# Patient Record
Sex: Female | Born: 1950 | Race: White | Hispanic: No | Marital: Married | Smoking: Never smoker
Health system: Southern US, Community
[De-identification: ages and names within clinical notes are randomized; demographics above are authoritative.]

## PROBLEM LIST (undated history)

## (undated) DIAGNOSIS — M1990 Unspecified osteoarthritis, unspecified site: Secondary | ICD-10-CM

## (undated) DIAGNOSIS — E559 Vitamin D deficiency, unspecified: Secondary | ICD-10-CM

## (undated) DIAGNOSIS — J309 Allergic rhinitis, unspecified: Secondary | ICD-10-CM

## (undated) DIAGNOSIS — E669 Obesity, unspecified: Secondary | ICD-10-CM

## (undated) DIAGNOSIS — E785 Hyperlipidemia, unspecified: Secondary | ICD-10-CM

## (undated) DIAGNOSIS — M8580 Other specified disorders of bone density and structure, unspecified site: Secondary | ICD-10-CM

## (undated) DIAGNOSIS — K644 Residual hemorrhoidal skin tags: Secondary | ICD-10-CM

## (undated) DIAGNOSIS — Z973 Presence of spectacles and contact lenses: Secondary | ICD-10-CM

## (undated) DIAGNOSIS — I10 Essential (primary) hypertension: Secondary | ICD-10-CM

## (undated) DIAGNOSIS — F329 Major depressive disorder, single episode, unspecified: Secondary | ICD-10-CM

## (undated) DIAGNOSIS — R7611 Nonspecific reaction to tuberculin skin test without active tuberculosis: Secondary | ICD-10-CM

## (undated) DIAGNOSIS — Z8249 Family history of ischemic heart disease and other diseases of the circulatory system: Secondary | ICD-10-CM

## (undated) DIAGNOSIS — F32A Depression, unspecified: Secondary | ICD-10-CM

## (undated) HISTORY — PX: WISDOM TOOTH EXTRACTION: SHX21

## (undated) HISTORY — DX: Essential (primary) hypertension: I10

## (undated) HISTORY — DX: Presence of spectacles and contact lenses: Z97.3

## (undated) HISTORY — DX: Allergic rhinitis, unspecified: J30.9

## (undated) HISTORY — DX: Nonspecific reaction to tuberculin skin test without active tuberculosis: R76.11

## (undated) HISTORY — DX: Family history of ischemic heart disease and other diseases of the circulatory system: Z82.49

## (undated) HISTORY — DX: Obesity, unspecified: E66.9

## (undated) HISTORY — DX: Residual hemorrhoidal skin tags: K64.4

## (undated) HISTORY — DX: Hyperlipidemia, unspecified: E78.5

## (undated) HISTORY — PX: COLONOSCOPY: SHX174

## (undated) HISTORY — DX: Unspecified osteoarthritis, unspecified site: M19.90

## (undated) HISTORY — PX: TONSILLECTOMY: SUR1361

## (undated) HISTORY — DX: Depression, unspecified: F32.A

## (undated) HISTORY — DX: Vitamin D deficiency, unspecified: E55.9

## (undated) HISTORY — DX: Other specified disorders of bone density and structure, unspecified site: M85.80

---
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
## 1978-12-08 HISTORY — PX: INGUINAL HERNIA REPAIR: SUR1180

## 1999-06-13 ENCOUNTER — Other Ambulatory Visit: Admission: RE | Admit: 1999-06-13 | Discharge: 1999-06-13 | Payer: Self-pay | Admitting: Family Medicine

## 2000-07-07 ENCOUNTER — Other Ambulatory Visit: Admission: RE | Admit: 2000-07-07 | Discharge: 2000-07-07 | Payer: Self-pay | Admitting: Family Medicine

## 2000-07-31 ENCOUNTER — Encounter: Admission: RE | Admit: 2000-07-31 | Discharge: 2000-07-31 | Payer: Self-pay | Admitting: Family Medicine

## 2000-07-31 ENCOUNTER — Encounter: Payer: Self-pay | Admitting: Family Medicine

## 2001-08-04 ENCOUNTER — Encounter: Payer: Self-pay | Admitting: Family Medicine

## 2001-08-04 ENCOUNTER — Encounter: Admission: RE | Admit: 2001-08-04 | Discharge: 2001-08-04 | Payer: Self-pay | Admitting: Family Medicine

## 2003-05-25 ENCOUNTER — Encounter: Payer: Self-pay | Admitting: Family Medicine

## 2003-05-25 ENCOUNTER — Ambulatory Visit (HOSPITAL_COMMUNITY): Admission: RE | Admit: 2003-05-25 | Discharge: 2003-05-25 | Payer: Self-pay | Admitting: Family Medicine

## 2004-05-27 ENCOUNTER — Ambulatory Visit (HOSPITAL_COMMUNITY): Admission: RE | Admit: 2004-05-27 | Discharge: 2004-05-27 | Payer: Self-pay | Admitting: Family Medicine

## 2004-12-04 ENCOUNTER — Other Ambulatory Visit: Admission: RE | Admit: 2004-12-04 | Discharge: 2004-12-04 | Payer: Self-pay | Admitting: Family Medicine

## 2005-06-13 ENCOUNTER — Ambulatory Visit (HOSPITAL_COMMUNITY): Admission: RE | Admit: 2005-06-13 | Discharge: 2005-06-13 | Payer: Self-pay | Admitting: Family Medicine

## 2005-12-15 ENCOUNTER — Other Ambulatory Visit: Admission: RE | Admit: 2005-12-15 | Discharge: 2005-12-15 | Payer: Self-pay | Admitting: Family Medicine

## 2006-06-17 ENCOUNTER — Ambulatory Visit (HOSPITAL_COMMUNITY): Admission: RE | Admit: 2006-06-17 | Discharge: 2006-06-17 | Payer: Self-pay | Admitting: Family Medicine

## 2006-12-18 ENCOUNTER — Other Ambulatory Visit: Admission: RE | Admit: 2006-12-18 | Discharge: 2006-12-18 | Payer: Self-pay | Admitting: Family Medicine

## 2007-06-23 ENCOUNTER — Ambulatory Visit (HOSPITAL_COMMUNITY): Admission: RE | Admit: 2007-06-23 | Discharge: 2007-06-23 | Payer: Self-pay | Admitting: Family Medicine

## 2008-01-17 ENCOUNTER — Other Ambulatory Visit: Admission: RE | Admit: 2008-01-17 | Discharge: 2008-01-17 | Payer: Self-pay | Admitting: Family Medicine

## 2008-06-23 ENCOUNTER — Ambulatory Visit (HOSPITAL_COMMUNITY): Admission: RE | Admit: 2008-06-23 | Discharge: 2008-06-23 | Payer: Self-pay | Admitting: Family Medicine

## 2008-12-08 LAB — HM COLONOSCOPY

## 2009-01-26 ENCOUNTER — Other Ambulatory Visit: Admission: RE | Admit: 2009-01-26 | Discharge: 2009-01-26 | Payer: Self-pay | Admitting: Family Medicine

## 2009-05-25 ENCOUNTER — Ambulatory Visit: Payer: Self-pay | Admitting: Family Medicine

## 2009-06-26 ENCOUNTER — Ambulatory Visit (HOSPITAL_COMMUNITY): Admission: RE | Admit: 2009-06-26 | Discharge: 2009-06-26 | Payer: Self-pay | Admitting: Family Medicine

## 2009-07-17 ENCOUNTER — Ambulatory Visit: Payer: Self-pay | Admitting: Family Medicine

## 2009-07-24 ENCOUNTER — Ambulatory Visit: Payer: Self-pay | Admitting: Family Medicine

## 2009-10-03 ENCOUNTER — Ambulatory Visit: Payer: Self-pay | Admitting: Family Medicine

## 2009-10-08 ENCOUNTER — Ambulatory Visit: Payer: Self-pay | Admitting: Family Medicine

## 2009-11-07 ENCOUNTER — Ambulatory Visit: Payer: Self-pay | Admitting: Family Medicine

## 2010-04-16 ENCOUNTER — Ambulatory Visit: Payer: Self-pay | Admitting: Family Medicine

## 2010-06-27 ENCOUNTER — Ambulatory Visit (HOSPITAL_COMMUNITY): Admission: RE | Admit: 2010-06-27 | Discharge: 2010-06-27 | Payer: Self-pay | Admitting: Family Medicine

## 2010-08-16 ENCOUNTER — Ambulatory Visit: Payer: Self-pay | Admitting: Family Medicine

## 2010-08-20 ENCOUNTER — Ambulatory Visit: Payer: Self-pay | Admitting: Family Medicine

## 2010-08-22 ENCOUNTER — Ambulatory Visit: Payer: Self-pay | Admitting: Family Medicine

## 2010-08-22 ENCOUNTER — Encounter: Admission: RE | Admit: 2010-08-22 | Discharge: 2010-08-22 | Payer: Self-pay | Admitting: Family Medicine

## 2011-04-03 ENCOUNTER — Ambulatory Visit (INDEPENDENT_AMBULATORY_CARE_PROVIDER_SITE_OTHER): Payer: BC Managed Care – PPO | Admitting: Family Medicine

## 2011-04-03 DIAGNOSIS — J309 Allergic rhinitis, unspecified: Secondary | ICD-10-CM

## 2011-05-29 ENCOUNTER — Other Ambulatory Visit: Payer: Self-pay | Admitting: Family Medicine

## 2011-05-29 DIAGNOSIS — Z1231 Encounter for screening mammogram for malignant neoplasm of breast: Secondary | ICD-10-CM

## 2011-06-30 ENCOUNTER — Ambulatory Visit (HOSPITAL_COMMUNITY)
Admission: RE | Admit: 2011-06-30 | Discharge: 2011-06-30 | Disposition: A | Discharge status: Home / Self Care | Payer: BC Managed Care – PPO | Source: Ambulatory Visit | Attending: Family Medicine | Admitting: Family Medicine

## 2011-06-30 DIAGNOSIS — Z1231 Encounter for screening mammogram for malignant neoplasm of breast: Secondary | ICD-10-CM | POA: Insufficient documentation

## 2011-08-19 ENCOUNTER — Encounter: Payer: Self-pay | Admitting: Family Medicine

## 2011-09-02 ENCOUNTER — Other Ambulatory Visit (HOSPITAL_COMMUNITY)
Admission: RE | Admit: 2011-09-02 | Discharge: 2011-09-02 | Disposition: A | Discharge status: Home / Self Care | Payer: BC Managed Care – PPO | Source: Ambulatory Visit | Attending: Family Medicine | Admitting: Family Medicine

## 2011-09-02 ENCOUNTER — Encounter: Payer: Self-pay | Admitting: Family Medicine

## 2011-09-02 ENCOUNTER — Ambulatory Visit (INDEPENDENT_AMBULATORY_CARE_PROVIDER_SITE_OTHER): Payer: BC Managed Care – PPO | Admitting: Family Medicine

## 2011-09-02 VITALS — BP 130/80 | HR 74 | Ht 65.5 in | Wt 193.0 lb

## 2011-09-02 DIAGNOSIS — I10 Essential (primary) hypertension: Secondary | ICD-10-CM | POA: Insufficient documentation

## 2011-09-02 DIAGNOSIS — M25569 Pain in unspecified knee: Secondary | ICD-10-CM

## 2011-09-02 DIAGNOSIS — M25562 Pain in left knee: Secondary | ICD-10-CM

## 2011-09-02 DIAGNOSIS — E785 Hyperlipidemia, unspecified: Secondary | ICD-10-CM | POA: Insufficient documentation

## 2011-09-02 DIAGNOSIS — J301 Allergic rhinitis due to pollen: Secondary | ICD-10-CM | POA: Insufficient documentation

## 2011-09-02 DIAGNOSIS — Z01419 Encounter for gynecological examination (general) (routine) without abnormal findings: Secondary | ICD-10-CM | POA: Insufficient documentation

## 2011-09-02 DIAGNOSIS — M899 Disorder of bone, unspecified: Secondary | ICD-10-CM

## 2011-09-02 DIAGNOSIS — Z79899 Other long term (current) drug therapy: Secondary | ICD-10-CM

## 2011-09-02 DIAGNOSIS — E669 Obesity, unspecified: Secondary | ICD-10-CM

## 2011-09-02 DIAGNOSIS — Z0000 Encounter for general adult medical examination without abnormal findings: Secondary | ICD-10-CM

## 2011-09-02 DIAGNOSIS — Z23 Encounter for immunization: Secondary | ICD-10-CM

## 2011-09-02 DIAGNOSIS — M8580 Other specified disorders of bone density and structure, unspecified site: Secondary | ICD-10-CM | POA: Insufficient documentation

## 2011-09-02 LAB — CBC WITH DIFFERENTIAL/PLATELET
Basophils Absolute: 0.1 10*3/uL (ref 0.0–0.1)
HCT: 38.4 % (ref 36.0–46.0)
Hemoglobin: 12.9 g/dL (ref 12.0–15.0)
Lymphocytes Relative: 36 % (ref 12–46)
Monocytes Absolute: 0.5 10*3/uL (ref 0.1–1.0)
Neutro Abs: 5.1 10*3/uL (ref 1.7–7.7)
RBC: 4.37 MIL/uL (ref 3.87–5.11)
RDW: 13 % (ref 11.5–15.5)
WBC: 9.1 10*3/uL (ref 4.0–10.5)

## 2011-09-02 LAB — COMPREHENSIVE METABOLIC PANEL
ALT: 34 U/L (ref 0–35)
AST: 25 U/L (ref 0–37)
Albumin: 4.3 g/dL (ref 3.5–5.2)
BUN: 13 mg/dL (ref 6–23)
Calcium: 9.5 mg/dL (ref 8.4–10.5)
Chloride: 100 mEq/L (ref 96–112)
Potassium: 3.6 mEq/L (ref 3.5–5.3)

## 2011-09-02 LAB — POCT URINALYSIS DIPSTICK
Bilirubin, UA: NEGATIVE
Glucose, UA: NEGATIVE
Ketones, UA: NEGATIVE
Leukocytes, UA: NEGATIVE
Protein, UA: NEGATIVE

## 2011-09-02 LAB — LIPID PANEL: HDL: 46 mg/dL (ref >39)

## 2011-09-02 NOTE — Progress Notes (Signed)
Subjective:    Patient ID: Karina Ingram, female    DOB: 12-24-1950, 60 y.o.   MRN: [id number]  HPI She is here for a complete examination. She does have underlying spring and fall allergies and is on medications for this and doing well. She continues on her blood pressure medication. She has a history of osteopenia and is using vitamin D and calcium. She continues on her Lipitor and is having no difficulty with this. She has no other concerns or complaints. Her social and family history were reviewed.  Review of Systems  Constitutional: Negative.   HENT: Negative.   Eyes: Negative.   Respiratory: Negative.   Cardiovascular: Negative.   Gastrointestinal: Negative.   Genitourinary: Negative.   Musculoskeletal: Negative.   Skin: Negative.   Neurological: Negative.   Hematological: Negative.        Objective:   Physical Exam BP 130/80  Pulse 74  Ht 5' 5.5" (1.664 m)  Wt 193 lb (87.544 kg)  BMI 31.63 kg/m2  General Appearance:    Alert, cooperative, no distress, appears stated age  Head:    Normocephalic, without obvious abnormality, atraumatic  Eyes:    PERRL, conjunctiva/corneas clear, EOM's intact, fundi    benign  Ears:    Normal TM's and external ear canals  Nose:   Nares normal, mucosa normal, no drainage or sinus   tenderness  Throat:   Lips, mucosa, and tongue normal; teeth and gums normal  Neck:   Supple, no lymphadenopathy;  thyroid:  no   enlargement/tenderness/nodules; no carotid   bruit or JVD  Back:    Spine nontender, no curvature, ROM normal, no CVA     tenderness  Lungs:     Clear to auscultation bilaterally without wheezes, rales or     ronchi; respirations unlabored  Chest Wall:    No tenderness or deformity   Heart:    Regular rate and rhythm, S1 and S2 normal, no murmur, rub   or gallop  Breast Exam:    No tenderness, masses, or nipple discharge or inversion.      No axillary lymphadenopathy  Abdomen:     Soft, non-tender, nondistended, normoactive  bowel sounds,    no masses, no hepatosplenomegaly  Genitalia:    Normal external genitalia without lesions.  BUS and vagina normal; cervix without lesions, or cervical motion tenderness. No abnormal vaginal discharge.  Uterus and adnexa not enlarged, nontender, no masses.  Pap performed  Rectal:    Normal tone, no masses or tenderness; guaiac negative stool  Extremities:   No clubbing, cyanosis or edema  Pulses:   2+ and symmetric all extremities  Skin:   Skin color, texture, turgor normal, no rashes or lesions  Lymph nodes:   Cervical, supraclavicular, and axillary nodes normal  Neurologic:   CNII-XII intact, normal strength, sensation and gait; reflexes 2+ and symmetric throughout          Psych:   Normal mood, affect, hygiene and grooming.          Assessment & Plan:   1. Left knee pain  DG Knee 1-2 Views Left  2. Physical exam, annual  POCT Urinalysis Dipstick, Cytology - PAP  3. Hypertension  CBC w/Diff, Comprehensive metabolic panel, Lipid Profile  4. Allergic rhinitis due to pollen    5. Obesity (BMI 30-39.9)  CBC w/Diff, Comprehensive metabolic panel, Lipid Profile  6. Osteopenia  DG Bone Density  7. Hyperlipidemia LDL goal < 100  Lipid Profile  8. Encounter  for long-term (current) use of other medications  CBC w/Diff, Comprehensive metabolic panel, Lipid Profile   The knee pain is an error

## 2011-09-03 ENCOUNTER — Other Ambulatory Visit: Payer: Self-pay | Admitting: *Deleted

## 2011-09-03 MED ORDER — ATORVASTATIN CALCIUM 10 MG PO TABS: 5.0000 mg | ORAL_TABLET | Freq: Every day | ORAL | Status: DC

## 2011-09-03 MED ORDER — LISINOPRIL-HYDROCHLOROTHIAZIDE 20-12.5 MG PO TABS: 1.0000 | ORAL_TABLET | Freq: Every day | ORAL | Status: DC

## 2011-09-03 MED ORDER — AMLODIPINE BESYLATE 5 MG PO TABS: 5.0000 mg | ORAL_TABLET | Freq: Every day | ORAL | Status: DC

## 2011-09-03 NOTE — Telephone Encounter (Addendum)
Message copied by Dorthula Perfect on Wed Sep 03, 2011  3:05 PM ------      Message from: Laureen Ochs F      Created: Wed Sep 03, 2011  9:06 AM      The blood work looks good. Continue on present medications. Renew any of her meds that she needs   Pt notified of lab results.  Sent in medications lisinopril HCTZ, atorvastatin, and norvasc to [facility].  CM, LPN

## 2011-09-05 ENCOUNTER — Telehealth: Payer: Self-pay

## 2011-09-05 NOTE — Telephone Encounter (Signed)
To let Karina Ingram know all labs were good

## 2012-05-25 ENCOUNTER — Other Ambulatory Visit: Payer: Self-pay | Admitting: Family Medicine

## 2012-05-26 ENCOUNTER — Other Ambulatory Visit: Payer: Self-pay | Admitting: Family Medicine

## 2012-05-26 DIAGNOSIS — Z1231 Encounter for screening mammogram for malignant neoplasm of breast: Secondary | ICD-10-CM

## 2012-06-18 ENCOUNTER — Other Ambulatory Visit: Payer: Self-pay | Admitting: Family Medicine

## 2012-06-30 ENCOUNTER — Ambulatory Visit (HOSPITAL_COMMUNITY)
Admission: RE | Admit: 2012-06-30 | Discharge: 2012-06-30 | Disposition: A | Discharge status: Home / Self Care | Payer: BC Managed Care – PPO | Source: Ambulatory Visit | Attending: Family Medicine | Admitting: Family Medicine

## 2012-06-30 DIAGNOSIS — Z1231 Encounter for screening mammogram for malignant neoplasm of breast: Secondary | ICD-10-CM | POA: Insufficient documentation

## 2012-09-14 ENCOUNTER — Encounter: Payer: Self-pay | Admitting: Medical

## 2012-09-14 ENCOUNTER — Ambulatory Visit (INDEPENDENT_AMBULATORY_CARE_PROVIDER_SITE_OTHER): Payer: BC Managed Care – PPO | Admitting: Medical

## 2012-09-14 VITALS — BP 132/88 | HR 68 | Temp 98.2°F | Resp 16 | Wt 195.0 lb

## 2012-09-14 DIAGNOSIS — Z23 Encounter for immunization: Secondary | ICD-10-CM

## 2012-09-14 DIAGNOSIS — B354 Tinea corporis: Secondary | ICD-10-CM

## 2012-09-14 MED ORDER — TERBINAFINE HCL 1 % EX CREA: TOPICAL_CREAM | Freq: Two times a day (BID) | CUTANEOUS | Status: DC

## 2012-09-14 NOTE — Progress Notes (Signed)
Subjective: Here for rash on back of neck x last few days.  Only exposure is she adopted a new dog that had several skin issues, had to be shaved due to infections.  No other skin contacts with similar rash.  Rash is red, round and itchy.  No prior similar.  thinks its ringworm.   Objective: Gen: wd, wn, nad Skin: left posterior neck with 2cm round well defined area of erythema, raised border and clearing center  Assessment: Encounter Diagnoses  Name Primary?  . Tinea corporis Yes  . Need for influenza vaccination    Plan: Tinea - begin Terbinafine cream, wash hands often, and call if not resolving. C/t cream for several days once rash has resolved.   Flu vaccine, VIS and counseling given.

## 2012-09-14 NOTE — Patient Instructions (Signed)
Begin OTC antifungal cream such as Lamisil cream or Terbinafine cream or Tolnaftate cream topically for 1-2 weeks.  This should clear this up.  If not, let me know.

## 2012-09-23 ENCOUNTER — Telehealth: Payer: Self-pay | Admitting: Medical

## 2012-09-23 NOTE — Telephone Encounter (Signed)
Pt advised.

## 2012-09-23 NOTE — Telephone Encounter (Signed)
Advise--contagious until resolved.  Avoid direct contact with others

## 2012-10-22 ENCOUNTER — Other Ambulatory Visit: Payer: Self-pay | Admitting: Family Medicine

## 2012-12-20 ENCOUNTER — Ambulatory Visit (INDEPENDENT_AMBULATORY_CARE_PROVIDER_SITE_OTHER): Payer: BC Managed Care – PPO | Admitting: Medical

## 2012-12-20 ENCOUNTER — Encounter: Payer: Self-pay | Admitting: Medical

## 2012-12-20 VITALS — BP 118/80 | HR 62 | Temp 97.9°F | Resp 16 | Wt 193.0 lb

## 2012-12-20 DIAGNOSIS — E785 Hyperlipidemia, unspecified: Secondary | ICD-10-CM

## 2012-12-20 DIAGNOSIS — M899 Disorder of bone, unspecified: Secondary | ICD-10-CM

## 2012-12-20 DIAGNOSIS — Z79899 Other long term (current) drug therapy: Secondary | ICD-10-CM

## 2012-12-20 DIAGNOSIS — I10 Essential (primary) hypertension: Secondary | ICD-10-CM

## 2012-12-20 DIAGNOSIS — M8580 Other specified disorders of bone density and structure, unspecified site: Secondary | ICD-10-CM

## 2012-12-20 LAB — LIPID PANEL
Cholesterol: 169 mg/dL (ref 0–200)
LDL Cholesterol: 86 mg/dL (ref 0–99)
Triglycerides: 181 mg/dL — ABNORMAL HIGH (ref <150)

## 2012-12-20 LAB — COMPREHENSIVE METABOLIC PANEL
Albumin: 4.5 g/dL (ref 3.5–5.2)
CO2: 30 mEq/L (ref 19–32)
Calcium: 10.3 mg/dL (ref 8.4–10.5)
Glucose, Bld: 96 mg/dL (ref 70–99)
Potassium: 4.5 mEq/L (ref 3.5–5.3)
Sodium: 142 mEq/L (ref 135–145)
Total Protein: 7 g/dL (ref 6.0–8.3)

## 2012-12-20 LAB — CBC WITH DIFFERENTIAL/PLATELET
Eosinophils Absolute: 0.3 10*3/uL (ref 0.0–0.7)
Lymphocytes Relative: 30 % (ref 12–46)
Lymphs Abs: 2.5 10*3/uL (ref 0.7–4.0)
Neutrophils Relative %: 59 % (ref 43–77)
Platelets: 335 10*3/uL (ref 150–400)
RBC: 4.88 MIL/uL (ref 3.87–5.11)
WBC: 8.5 10*3/uL (ref 4.0–10.5)

## 2012-12-20 MED ORDER — AMLODIPINE BESYLATE 5 MG PO TABS: 5.0000 mg | ORAL_TABLET | Freq: Every day | ORAL | Status: DC

## 2012-12-20 MED ORDER — LISINOPRIL-HYDROCHLOROTHIAZIDE 20-12.5 MG PO TABS: 1.0000 | ORAL_TABLET | Freq: Every day | ORAL | Status: DC

## 2012-12-20 NOTE — Progress Notes (Signed)
Subjective: Here for routine medication refills, needs medication, fasting lab check today.   She is on medication and compliant for blood pressure and cholesterol.  Wasn't exercising much this fall as mother-in-law was in final stages of her illness.  She has started exercising back now.   getting better with diet now that the holidays are getting behind [place].  No major c/o.  She is taking Ca+D.  Last bone density prior to 2012 was 03/2011 but she never heard results of either bone density scan.  No new c/o.  Past Medical History  Diagnosis Date  . Hypertension   . Dyslipidemia   . Arthritis   . Allergy     RHINITIS  . Positive PPD, treated   . Osteopenia    ROS in subjective  Objective Gen: wd, wn, nad Skin unremarkable Neck supple, nontender, no mass,no thyromegaly Lungs clear Heart RRR, normal S1, S2, no murmurs Abdomen: +bs, soft, nontender, no mass or organomegaly Pulses normal  Assessment: Encounter Diagnoses  Name Primary?  . Essential hypertension, benign Yes  . Hyperlipidemia   . Osteopenia   . Encounter for long-term (current) use of other medications     Plan: C/t current medications.  Labs today.  C/t Ca+Vit D.  Advised weight bearing and aerobic exercise, healthy diet, and we will call back with lab results and  Recommendations regarding bone density.  Will also try and get prior 03/2009 bone density results to compare.  Pending labs, refill lipid medication.   BP medications refilled today.

## 2012-12-21 ENCOUNTER — Other Ambulatory Visit: Payer: Self-pay | Admitting: Medical

## 2012-12-21 LAB — VITAMIN D 25 HYDROXY (VIT D DEFICIENCY, FRACTURES): Vit D, 25-Hydroxy: 42 ng/mL (ref 30–89)

## 2012-12-21 MED ORDER — ATORVASTATIN CALCIUM 10 MG PO TABS: 10.0000 mg | ORAL_TABLET | Freq: Every day | ORAL | Status: DC

## 2013-03-08 ENCOUNTER — Encounter: Payer: Self-pay | Admitting: Medical

## 2013-04-08 ENCOUNTER — Other Ambulatory Visit: Payer: Self-pay | Admitting: Family Medicine

## 2013-04-09 ENCOUNTER — Other Ambulatory Visit: Payer: Self-pay | Admitting: Family Medicine

## 2013-06-03 ENCOUNTER — Other Ambulatory Visit: Payer: Self-pay | Admitting: Family Medicine

## 2013-06-03 DIAGNOSIS — Z1231 Encounter for screening mammogram for malignant neoplasm of breast: Secondary | ICD-10-CM

## 2013-07-01 ENCOUNTER — Ambulatory Visit (HOSPITAL_COMMUNITY)
Admission: RE | Admit: 2013-07-01 | Discharge: 2013-07-01 | Disposition: A | Discharge status: Home / Self Care | Payer: BC Managed Care – PPO | Source: Ambulatory Visit | Attending: Family Medicine | Admitting: Family Medicine

## 2013-07-01 DIAGNOSIS — Z1231 Encounter for screening mammogram for malignant neoplasm of breast: Secondary | ICD-10-CM | POA: Insufficient documentation

## 2013-09-22 LAB — HM DEXA SCAN

## 2013-09-27 ENCOUNTER — Telehealth: Payer: Self-pay | Admitting: Medical

## 2013-09-27 NOTE — Telephone Encounter (Signed)
Patient is aware of her Bone density results. CLS

## 2013-09-27 NOTE — Telephone Encounter (Signed)
I reviewed her bone density scan.  There was actually slight improvement in the bone density from 2012.   Thus, c/t Calcium 1200mg  daily, 1000 IU Vit D daily, routine exercise, and f/u in January for physical.   Next bone density scan not recommended until 2019.

## 2013-09-27 NOTE — Telephone Encounter (Signed)
LMOM TO CB. CLS 

## 2013-09-28 ENCOUNTER — Encounter: Payer: Self-pay | Admitting: Internal Medicine

## 2013-12-06 ENCOUNTER — Encounter: Payer: Self-pay | Admitting: Medical

## 2013-12-12 ENCOUNTER — Encounter: Payer: Self-pay | Admitting: Medical

## 2013-12-12 ENCOUNTER — Ambulatory Visit (INDEPENDENT_AMBULATORY_CARE_PROVIDER_SITE_OTHER): Payer: BC Managed Care – PPO | Admitting: Medical

## 2013-12-12 ENCOUNTER — Other Ambulatory Visit (HOSPITAL_COMMUNITY)
Admission: RE | Admit: 2013-12-12 | Discharge: 2013-12-12 | Disposition: A | Discharge status: Home / Self Care | Payer: BC Managed Care – PPO | Source: Ambulatory Visit | Attending: Family Medicine | Admitting: Family Medicine

## 2013-12-12 VITALS — BP 160/90 | HR 60 | Temp 97.5°F | Resp 16 | Ht 66.0 in | Wt 194.0 lb

## 2013-12-12 DIAGNOSIS — Z124 Encounter for screening for malignant neoplasm of cervix: Secondary | ICD-10-CM

## 2013-12-12 DIAGNOSIS — E559 Vitamin D deficiency, unspecified: Secondary | ICD-10-CM

## 2013-12-12 DIAGNOSIS — E785 Hyperlipidemia, unspecified: Secondary | ICD-10-CM

## 2013-12-12 DIAGNOSIS — I10 Essential (primary) hypertension: Secondary | ICD-10-CM

## 2013-12-12 DIAGNOSIS — Z23 Encounter for immunization: Secondary | ICD-10-CM

## 2013-12-12 DIAGNOSIS — Z2911 Encounter for prophylactic immunotherapy for respiratory syncytial virus (RSV): Secondary | ICD-10-CM

## 2013-12-12 DIAGNOSIS — Z0000 Encounter for general adult medical examination without abnormal findings: Secondary | ICD-10-CM

## 2013-12-12 DIAGNOSIS — Z01419 Encounter for gynecological examination (general) (routine) without abnormal findings: Secondary | ICD-10-CM | POA: Insufficient documentation

## 2013-12-12 LAB — POCT URINALYSIS DIPSTICK
Bilirubin, UA: NEGATIVE
Glucose, UA: NEGATIVE
Ketones, UA: NEGATIVE
Leukocytes, UA: NEGATIVE
Nitrite, UA: NEGATIVE
PROTEIN UA: NEGATIVE
RBC UA: NEGATIVE
UROBILINOGEN UA: NEGATIVE
pH, UA: 6

## 2013-12-12 LAB — COMPREHENSIVE METABOLIC PANEL
ALT: 53 U/L — ABNORMAL HIGH (ref 0–35)
AST: 33 U/L (ref 0–37)
Albumin: 4 g/dL (ref 3.5–5.2)
Alkaline Phosphatase: 116 U/L (ref 39–117)
BUN: 13 mg/dL (ref 6–23)
CO2: 28 mEq/L (ref 19–32)
Calcium: 10.4 mg/dL (ref 8.4–10.5)
Chloride: 101 mEq/L (ref 96–112)
Creat: 0.69 mg/dL (ref 0.50–1.10)
GLUCOSE: 88 mg/dL (ref 70–99)
Potassium: 4.4 mEq/L (ref 3.5–5.3)
SODIUM: 141 meq/L (ref 135–145)
Total Bilirubin: 0.5 mg/dL (ref 0.3–1.2)
Total Protein: 7 g/dL (ref 6.0–8.3)

## 2013-12-12 LAB — CBC
HCT: 41.3 % (ref 36.0–46.0)
HEMOGLOBIN: 14.2 g/dL (ref 12.0–15.0)
MCH: 29.2 pg (ref 26.0–34.0)
MCHC: 34.4 g/dL (ref 30.0–36.0)
MCV: 85 fL (ref 78.0–100.0)
Platelets: 330 10*3/uL (ref 150–400)
RBC: 4.86 MIL/uL (ref 3.87–5.11)
RDW: 13.6 % (ref 11.5–15.5)
WBC: 9.2 10*3/uL (ref 4.0–10.5)

## 2013-12-12 NOTE — Patient Instructions (Signed)
Please check your insurance coverage for NMR lipo profile lab to better evaluate your lipids.

## 2013-12-12 NOTE — Progress Notes (Signed)
Subjective:   HPI  Karina Ingram is a 63 y.o. female who presents for a complete physical.   Preventative care: Last ophthalmology visit:yes Dr. Allyne Gee Last dental visit:Dr adriatto Last colonoscopy:5 years years ago Last mammogram:7 2014 Last gynecological exam: Last ZOX:WRUE Last labs:?  Prior vaccinations: TD or Tdap:2011 Influenza:10 2013 Pneumococcal: never Shingles/Zostavax:12/12/2013 Other: no other doctors  Advanced directive:n/a Health care power of attorney:n/a Living will:n/a  Reviewed their medical, surgical, family, social, medication, and allergy history and updated chart as appropriate.  Past Medical History  Diagnosis Date  . Hypertension   . Dyslipidemia   . Arthritis   . Allergy     RHINITIS  . Positive PPD, treated   . Osteopenia     bone density 2013  . Wears contact lenses   . Hyperlipidemia   . External hemorrhoid   . Vitamin D deficiency     Past Surgical History  Procedure Laterality Date  . Hernia repair      inguinal, early '80s  . Tonsillectomy    . Wisdom tooth extraction    . Colonoscopy      [facility], age 36yo    History   Social History  . Marital Status: Married    Spouse Name: N/A    Number of Children: N/A  . Years of Education: N/A   Occupational History  . Not on file.   Social History Main Topics  . Smoking status: Never Smoker   . Smokeless tobacco: Never Used  . Alcohol Use: Yes  . Drug Use: No  . Sexual Activity: Yes   Other Topics Concern  . Not on file   Social History Narrative   Married, [employer], exercise some with walking, teacher    Family History  Problem Relation Age of Onset  . Cancer Mother     hematologic  . Heart disease Mother     angina  . Hypertension Mother   . Stroke Father   . Hypertension Father   . Heart disease Father   . Other Sister     adopted  . Cancer Brother     renal  . Hypertension Brother   . Hyperlipidemia Brother     Current outpatient  prescriptions:amLODipine (NORVASC) 5 MG tablet, Take 1 tablet (5 mg total) by mouth daily., Disp: 90 tablet, Rfl: 3;  aspirin 81 MG tablet, Take 81 mg by mouth daily.  , Disp: , Rfl: ;  atorvastatin (LIPITOR) 10 MG tablet, Take 1 tablet (10 mg total) by mouth daily., Disp: 45 tablet, Rfl: 3;  calcium-vitamin D (OSCAL WITH D) 500-200 MG-UNIT per tablet, Take 1 tablet by mouth daily.  , Disp: , Rfl:  fexofenadine (ALLEGRA) 30 MG tablet, Take 30 mg by mouth 2 (two) times daily.  , Disp: , Rfl: ;  fish oil-omega-3 fatty acids 1000 MG capsule, Take 2 g by mouth daily.  , Disp: , Rfl: ;  fluticasone (FLONASE) 50 MCG/ACT nasal spray, USE TWO SPRAYS IN EACH NOSTRIL DAILY, Disp: 16 g, Rfl: 5;  lisinopril-hydrochlorothiazide (PRINZIDE,ZESTORETIC) 20-12.5 MG per tablet, Take 1 tablet by mouth daily., Disp: 90 tablet, Rfl: 3 Multiple Vitamins-Minerals (MULTIVITAMIN WITH MINERALS) tablet, Take 1 tablet by mouth daily.  , Disp: , Rfl:   No Known Allergies      Review of Systems Constitutional: -fever, -chills, -sweats, -unexpected weight change, -decreased appetite, -fatigue Allergy: -sneezing, -itching, -congestion Dermatology: -changing moles, --rash, -lumps ENT: -runny nose, -ear pain, -sore throat, -hoarseness, -sinus pain, -teeth pain, -  ringing in ears, -hearing loss, -nosebleeds Cardiology: -chest pain, -palpitations, +swelling, -difficulty breathing when lying flat, -waking up short of breath Respiratory: -cough, -shortness of breath, -difficulty breathing with exercise or exertion, -wheezing, -coughing up blood Gastroenterology: -abdominal pain, -nausea, -vomiting, -diarrhea, -constipation, -blood in stool, -changes in bowel movement, -difficulty swallowing or eating Hematology: -bleeding, -bruising  Musculoskeletal: -joint aches, -muscle aches, -joint swelling, -back pain, -neck pain, -cramping, -changes in gait Ophthalmology: denies vision changes, eye redness, itching, discharge Urology: -burning  with urination, -difficulty urinating, -blood in urine, -urinary frequency, -urgency, -incontinence Neurology: -headache, -weakness, -tingling, -numbness, -memory loss, -falls, -dizziness Psychology: -depressed mood, -agitation, -sleep problems     Objective:   Physical Exam  BP 160/90  Pulse 60  Temp(Src) 97.5 F (36.4 C) (Oral)  Resp 16  Ht 5\' 6"  (1.676 m)  Wt 194 lb (87.998 kg)  BMI 31.33 kg/m2  General appearance: alert, no distress, WD/WN, white female Skin: scattered benign lesions, several cherry hemangiomas, small throughout torso, no particular worrisome lesions HEENT: normocephalic, conjunctiva/corneas normal, sclerae anicteric, PERRLA, EOMi, nares patent, no discharge or erythema, pharynx normal Oral cavity: MMM, tongue normal, teeth in good repair Neck: supple, no lymphadenopathy, no thyromegaly, no masses, normal ROM, no bruits Chest: non tender, normal shape and expansion Heart: RRR, normal S1, S2, no murmurs Lungs: CTA bilaterally, no wheezes, rhonchi, or rales Abdomen: +bs, soft, non tender, non distended, no masses, no hepatomegaly, no splenomegaly, no bruits Back: non tender, normal ROM, no scoliosis Musculoskeletal: upper extremities non tender, no obvious deformity, normal ROM throughout, lower extremities non tender, no obvious deformity, normal ROM throughout Extremities: no edema, no cyanosis, no clubbing Pulses: 2+ symmetric, upper and lower extremities, normal cap refill Neurological: alert, oriented x 3, CN2-12 intact, strength normal upper extremities and lower extremities, sensation normal throughout, DTRs 2+ throughout, no cerebellar signs, gait normal Psychiatric: normal affect, behavior normal, pleasant  Breast: nontender, no masses or lumps, no skin changes, no nipple discharge or inversion, no axillary lymphadenopathy Gyn: Normal external genitalia without lesions, vagina with normal mucosa, cervix without lesions, no cervical motion tenderness, no  abnormal vaginal discharge.  Uterus and adnexa not enlarged, nontender, no masses.  Pap performed.  Exam chaperoned by nurse. Rectal: anus with external hemorrhoids, normal tone, occult negative stool    Assessment and Plan :    Encounter Diagnoses  Name Primary?  . Routine general medical examination at a health care facility Yes  . Essential hypertension, benign   . Hyperlipidemia   . Unspecified vitamin D deficiency   . Screening for cervical cancer   . Need for shingles vaccine   . Need for prophylactic vaccination against Streptococcus pneumoniae (pneumococcus)     Physical exam - discussed healthy lifestyle, diet, exercise, preventative care, vaccinations, and addressed their concerns.  Handout given.  Labs today.   Hypertension-continue current medication Hyperlipidemia-she will check insurance  for an NMR lipoprofile lab.   Given her cardiac risk factors - HTN, family history, age, her prior lipid panels may not justify statin therapy, but we discussed the benefits and risks of medications. Vitamin D deficiency-labs today, continue current medication  Pap smear sent today  We discussed shingles and pneumococcal vaccines. Counseled on both. Shingles vaccine administered today, and she will followup no sooner than a month for pneumococcal vaccine She will see dermatology for a baseline skin surveillance.  Follow-up pending labs

## 2013-12-13 ENCOUNTER — Other Ambulatory Visit: Payer: Self-pay | Admitting: Medical

## 2013-12-13 LAB — VITAMIN D 25 HYDROXY (VIT D DEFICIENCY, FRACTURES): Vit D, 25-Hydroxy: 53 ng/mL (ref 30–89)

## 2013-12-30 NOTE — Addendum Note (Signed)
Addended by: Janeice RobinsonSCALES, Shyne Resch L on: 12/30/2013 01:48 PM   Modules accepted: Orders

## 2014-01-18 ENCOUNTER — Other Ambulatory Visit: Payer: BC Managed Care – PPO

## 2014-02-07 ENCOUNTER — Ambulatory Visit (INDEPENDENT_AMBULATORY_CARE_PROVIDER_SITE_OTHER): Payer: BC Managed Care – PPO | Admitting: Medical

## 2014-02-07 ENCOUNTER — Encounter: Payer: Self-pay | Admitting: Medical

## 2014-02-07 DIAGNOSIS — E782 Mixed hyperlipidemia: Secondary | ICD-10-CM

## 2014-02-07 DIAGNOSIS — Z8249 Family history of ischemic heart disease and other diseases of the circulatory system: Secondary | ICD-10-CM

## 2014-02-07 DIAGNOSIS — I10 Essential (primary) hypertension: Secondary | ICD-10-CM

## 2014-02-07 MED ORDER — LISINOPRIL-HYDROCHLOROTHIAZIDE 20-12.5 MG PO TABS: 1.0000 | ORAL_TABLET | Freq: Every day | ORAL | Status: DC

## 2014-02-07 MED ORDER — ATORVASTATIN CALCIUM 10 MG PO TABS: 10.0000 mg | ORAL_TABLET | Freq: Every day | ORAL | Status: DC

## 2014-02-07 NOTE — Progress Notes (Signed)
  Subjective:  Karina Ingram is a 63 y.o. female who presents to discuss her NMR lipo profile results.   At last visit she stopped her statin to check this test to further evaluate whether she needed to in fact be on a statin.  She is compliant with her BP medication, taking 81mg  ASA nightly, taking OTC fish oil, eats healthy.   No other aggravating or relieving factors.    No other c/o.  The following portions of the patient's history were reviewed and updated as appropriate: allergies, current medications, past family history, past medical history, past social history, past surgical history and problem list.  ROS Otherwise as in subjective above  Objective: Physical Exam  Vital signs reviewed  General appearance: alert, no distress, WD/WN   Assessment: Encounter Diagnoses  Name Primary?  . Essential hypertension, benign Yes  . Family history of premature CAD   . Mixed dyslipidemia      Plan: Patient's cardiac risk factors are: dyslipidemia, family history of premature cardiovascular disease and hypertension.   Discussed the lengthy [facility] NMR lipo profile results, her cardiac risks factors, means to lower her risk factors.  She will restart and c/t statin indefinitely.  She will check insurance coverage for repeat NMR Lipo profile 3-6 mo after begin back on statin.  She will stop OTC fish oil as it doesn't seem to be helping.  Discussed healthy low cholesterol heart healthy diet, discussed aspirin use daily QHS, c/t current BP medication.   Follow up: 3-6 mo, depending upon insurer's allowance of repeat NMR test while on statin.

## 2014-02-14 ENCOUNTER — Encounter: Payer: Self-pay | Admitting: Medical

## 2014-06-06 ENCOUNTER — Other Ambulatory Visit: Payer: Self-pay | Admitting: Family Medicine

## 2014-06-06 DIAGNOSIS — Z1231 Encounter for screening mammogram for malignant neoplasm of breast: Secondary | ICD-10-CM

## 2014-06-22 ENCOUNTER — Telehealth: Payer: Self-pay | Admitting: Family Medicine

## 2014-06-22 NOTE — Telephone Encounter (Signed)
CALLED AND LEFT MESSAGE WORD FOR WORD

## 2014-06-22 NOTE — Telephone Encounter (Signed)
Have her use any OTC med but also tried NyQuil at night. She still has a thin another week have her make an appointment .

## 2014-06-26 ENCOUNTER — Other Ambulatory Visit: Payer: Self-pay | Admitting: Medical

## 2014-07-05 ENCOUNTER — Ambulatory Visit (HOSPITAL_COMMUNITY)
Admission: RE | Admit: 2014-07-05 | Discharge: 2014-07-05 | Disposition: A | Discharge status: Home / Self Care | Payer: BC Managed Care – PPO | Source: Ambulatory Visit | Attending: Family Medicine | Admitting: Family Medicine

## 2014-07-05 DIAGNOSIS — Z1231 Encounter for screening mammogram for malignant neoplasm of breast: Secondary | ICD-10-CM | POA: Insufficient documentation

## 2014-07-25 ENCOUNTER — Telehealth: Payer: Self-pay | Admitting: Medical

## 2014-07-25 NOTE — Telephone Encounter (Signed)
Please call  Patient states she received a message in My Chart that she needed to schedule a colonoscopy  She states her last colonoscopy she thinks was in 2009, but she doesn't know where she had done

## 2014-07-26 NOTE — Telephone Encounter (Signed)
[facility]   The colonoscopy is not scanned in.  Let's see if we can pull the paper chart and look to find the records, scan the prior colonoscopy, and see what the recommendation was.   Very well could be do now.

## 2014-09-25 ENCOUNTER — Other Ambulatory Visit: Payer: Self-pay | Admitting: Medical

## 2014-10-07 ENCOUNTER — Encounter: Payer: Self-pay | Admitting: Internal Medicine

## 2014-12-11 ENCOUNTER — Telehealth: Payer: Self-pay | Admitting: Medical

## 2014-12-12 ENCOUNTER — Other Ambulatory Visit: Payer: Self-pay | Admitting: Family Medicine

## 2014-12-12 MED ORDER — AMLODIPINE BESYLATE 5 MG PO TABS: 5.0000 mg | ORAL_TABLET | Freq: Every day | ORAL | Status: DC

## 2014-12-12 MED ORDER — LISINOPRIL-HYDROCHLOROTHIAZIDE 20-12.5 MG PO TABS: 1.0000 | ORAL_TABLET | Freq: Every day | ORAL | Status: DC

## 2014-12-12 NOTE — Telephone Encounter (Signed)
Medication refills was sent to her pharmacy

## 2014-12-27 ENCOUNTER — Encounter: Payer: Self-pay | Admitting: Medical

## 2014-12-27 ENCOUNTER — Ambulatory Visit (INDEPENDENT_AMBULATORY_CARE_PROVIDER_SITE_OTHER): Payer: BC Managed Care – PPO | Admitting: Medical

## 2014-12-27 VITALS — BP 130/80 | HR 79 | Temp 98.0°F | Resp 14 | Ht 65.2 in | Wt 197.0 lb

## 2014-12-27 DIAGNOSIS — E559 Vitamin D deficiency, unspecified: Secondary | ICD-10-CM

## 2014-12-27 DIAGNOSIS — E785 Hyperlipidemia, unspecified: Secondary | ICD-10-CM | POA: Insufficient documentation

## 2014-12-27 DIAGNOSIS — H02401 Unspecified ptosis of right eyelid: Secondary | ICD-10-CM

## 2014-12-27 DIAGNOSIS — H9192 Unspecified hearing loss, left ear: Secondary | ICD-10-CM | POA: Diagnosis not present

## 2014-12-27 DIAGNOSIS — M8580 Other specified disorders of bone density and structure, unspecified site: Secondary | ICD-10-CM | POA: Diagnosis not present

## 2014-12-27 DIAGNOSIS — I8393 Asymptomatic varicose veins of bilateral lower extremities: Secondary | ICD-10-CM

## 2014-12-27 DIAGNOSIS — I8390 Asymptomatic varicose veins of unspecified lower extremity: Secondary | ICD-10-CM

## 2014-12-27 DIAGNOSIS — H02409 Unspecified ptosis of unspecified eyelid: Secondary | ICD-10-CM | POA: Insufficient documentation

## 2014-12-27 DIAGNOSIS — I10 Essential (primary) hypertension: Secondary | ICD-10-CM

## 2014-12-27 DIAGNOSIS — M25561 Pain in right knee: Secondary | ICD-10-CM | POA: Diagnosis not present

## 2014-12-27 DIAGNOSIS — H6122 Impacted cerumen, left ear: Secondary | ICD-10-CM

## 2014-12-27 DIAGNOSIS — J309 Allergic rhinitis, unspecified: Secondary | ICD-10-CM

## 2014-12-27 DIAGNOSIS — Z0000 Encounter for general adult medical examination without abnormal findings: Secondary | ICD-10-CM

## 2014-12-27 DIAGNOSIS — M25562 Pain in left knee: Secondary | ICD-10-CM

## 2014-12-27 DIAGNOSIS — E669 Obesity, unspecified: Secondary | ICD-10-CM | POA: Diagnosis not present

## 2014-12-27 LAB — LIPID PANEL
CHOL/HDL RATIO: 3.8 ratio
CHOLESTEROL: 191 mg/dL (ref 0–200)
HDL: 50 mg/dL (ref >39)
LDL Cholesterol: 87 mg/dL (ref 0–99)
Triglycerides: 269 mg/dL — ABNORMAL HIGH (ref <150)
VLDL: 54 mg/dL — AB (ref 0–40)

## 2014-12-27 LAB — COMPREHENSIVE METABOLIC PANEL
ALK PHOS: 97 U/L (ref 39–117)
ALT: 26 U/L (ref 0–35)
AST: 19 U/L (ref 0–37)
Albumin: 4 g/dL (ref 3.5–5.2)
BUN: 16 mg/dL (ref 6–23)
CO2: 31 mEq/L (ref 19–32)
Calcium: 9.7 mg/dL (ref 8.4–10.5)
Chloride: 101 mEq/L (ref 96–112)
Creat: 0.79 mg/dL (ref 0.50–1.10)
Glucose, Bld: 103 mg/dL — ABNORMAL HIGH (ref 70–99)
Potassium: 4.2 mEq/L (ref 3.5–5.3)
SODIUM: 140 meq/L (ref 135–145)
Total Bilirubin: 0.6 mg/dL (ref 0.2–1.2)
Total Protein: 7.1 g/dL (ref 6.0–8.3)

## 2014-12-27 LAB — CBC
HCT: 43.6 % (ref 36.0–46.0)
HEMOGLOBIN: 14.5 g/dL (ref 12.0–15.0)
MCH: 29.4 pg (ref 26.0–34.0)
MCHC: 33.3 g/dL (ref 30.0–36.0)
MCV: 88.3 fL (ref 78.0–100.0)
MPV: 9.3 fL (ref 8.6–12.4)
Platelets: 332 10*3/uL (ref 150–400)
RBC: 4.94 MIL/uL (ref 3.87–5.11)
RDW: 13.7 % (ref 11.5–15.5)
WBC: 6.9 10*3/uL (ref 4.0–10.5)

## 2014-12-27 LAB — POCT URINALYSIS DIPSTICK
BILIRUBIN UA: NEGATIVE
Blood, UA: NEGATIVE
Glucose, UA: NEGATIVE
Ketones, UA: NEGATIVE
Nitrite, UA: NEGATIVE
Protein, UA: NEGATIVE
Spec Grav, UA: 1.02
Urobilinogen, UA: NEGATIVE
pH, UA: 7

## 2014-12-27 LAB — URIC ACID: URIC ACID, SERUM: 5.1 mg/dL (ref 2.4–7.0)

## 2014-12-27 LAB — TSH: TSH: 2.097 u[IU]/mL (ref 0.350–4.500)

## 2014-12-27 NOTE — Progress Notes (Signed)
Subjective:   HPI  Karina Ingram is a 64 y.o. female who presents for a complete physical.  Medical care team includes:  Dermatology Kristian Covey, PA-C here with Dr. Susann Givens   Preventative care: Last ophthalmology visit:LAST EYE EXAM 9/ 2015 Last dental visit: sees dentist  Last colonoscopy:02/2008 Last mammogram:06/2014 Last gynecological exam:1/ 2015 Last EKG:N/A Last labs:12/2013  Prior vaccinations: TD or Tdap:08/2010 Influenza:09/2014 Pneumococcal:N/A Shingles/Zostavax:12/2013  Concerns: Ongoing knee pains several days per week, but takes cherry juice every morning which seems to help.  No giving way, no severe pain, not wanting to pursue xrays.  Hearing seems decreased on the left of late  Compliant with blood pressure medication without complaint  Compliant with cholesterol medication without complaint  Takes vitamin D daily, exercises been limited of late  Reviewed their medical, surgical, family, social, medication, and allergy history and updated chart as appropriate.  Past Medical History  Diagnosis Date  . Hypertension   . Dyslipidemia   . Arthritis   . Allergy     RHINITIS  . Positive PPD, treated   . Osteopenia     bone density 2013  . Wears contact lenses   . External hemorrhoid   . Vitamin D deficiency     Past Surgical History  Procedure Laterality Date  . Hernia repair      inguinal, early '80s  . Tonsillectomy    . Wisdom tooth extraction    . Colonoscopy      [facility], age 10yo; repeat 2019    History   Social History  . Marital Status: Married    Spouse Name: N/A    Number of Children: N/A  . Years of Education: N/A   Occupational History  . Not on file.   Social History Main Topics  . Smoking status: Never Smoker   . Smokeless tobacco: Never Used  . Alcohol Use: Yes     Comment: 2 per month  . Drug Use: No  . Sexual Activity: Yes   Other Topics Concern  . Not on file   Social History Narrative    Married, [employer], exercise some with walking, former Runner, broadcasting/film/video, retired.  Volunteer at [employer], involved in church, sees friends regularly.      Family History  Problem Relation Age of Onset  . Cancer Mother     hematologic  . Heart disease Mother     angina  . Hypertension Mother   . Stroke Father   . Hypertension Father   . Heart disease Father   . COPD Father     emphesema  . Other Sister     adopted  . Cancer Brother     renal  . Hypertension Brother   . Hyperlipidemia Brother      Current outpatient prescriptions:  .  amLODipine (NORVASC) 5 MG tablet, Take 1 tablet (5 mg total) by mouth daily., Disp: 90 tablet, Rfl: 0 .  aspirin 81 MG tablet, Take 81 mg by mouth daily.  , Disp: , Rfl:  .  atorvastatin (LIPITOR) 10 MG tablet, Take 1 tablet (10 mg total) by mouth daily., Disp: 90 tablet, Rfl: 2 .  calcium-vitamin D (OSCAL WITH D) 500-200 MG-UNIT per tablet, Take 1 tablet by mouth daily.  , Disp: , Rfl:  .  fexofenadine (ALLEGRA) 30 MG tablet, Take 30 mg by mouth 2 (two) times daily.  , Disp: , Rfl:  .  lisinopril-hydrochlorothiazide (PRINZIDE,ZESTORETIC) 20-12.5 MG per tablet, Take 1 tablet by mouth daily., Disp:  90 tablet, Rfl: 0 .  Multiple Vitamins-Minerals (MULTIVITAMIN WITH MINERALS) tablet, Take 1 tablet by mouth daily.  , Disp: , Rfl:  .  fluticasone (FLONASE) 50 MCG/ACT nasal spray, USE TWO SPRAYS IN EACH NOSTRIL DAILY (Patient not taking: Reported on 12/27/2014), Disp: 16 g, Rfl: 5  No Known Allergies     Review of Systems Constitutional: -fever, -chills, -sweats, -unexpected weight change, -decreased appetite, -fatigue Allergy: -sneezing, -itching, -congestion Dermatology: -changing moles, --rash, -lumps ENT: -runny nose, -ear pain, -sore throat, -hoarseness, -sinus pain, -teeth pain, - ringing in ears, -hearing loss, -nosebleeds Cardiology: -chest pain, -palpitations, -swelling, -difficulty breathing when lying flat, -waking up short of  breath Respiratory: -cough, -shortness of breath, -difficulty breathing with exercise or exertion, -wheezing, -coughing up blood Gastroenterology: -abdominal pain, -nausea, -vomiting, -diarrhea, -constipation, -blood in stool, -changes in bowel movement, -difficulty swallowing or eating Hematology: -bleeding, -bruising  Musculoskeletal: -joint aches, +muscle aches, -joint swelling, -back pain, -neck pain, -cramping, -changes in gait Ophthalmology: denies vision changes, eye redness, itching, discharge Urology: -burning with urination, -difficulty urinating, -blood in urine, -urinary frequency, -urgency, -incontinence Neurology: -headache, -weakness, -tingling, -numbness, -memory loss, -falls, -dizziness Psychology: -depressed mood, -agitation, -sleep problems     Objective:   Physical Exam  BP 130/80 mmHg  Pulse 79  Temp(Src) 98 F (36.7 C) (Oral)  Resp 14  Ht 5' 5.2" (1.656 m)  Wt 197 lb (89.359 kg)  BMI 32.58 kg/m2  General appearance: alert, no distress, WD/WN, white female Skin: scattered benign lesions, several cherry hemangiomas, small throughout torso, no particular worrisome lesions HEENT: normocephalic, conjunctiva/corneas normal, sclerae anicteric, PERRLA, EOMi, nares patent, no discharge or erythema, pharynx normal Oral cavity: MMM, tongue normal, teeth in good repair Neck: supple, no lymphadenopathy, no thyromegaly, no masses, normal ROM, no bruits Chest: non tender, normal shape and expansion Heart: RRR, normal S1, S2, no murmurs Lungs: CTA bilaterally, no wheezes, rhonchi, or rales Abdomen: +bs, soft, non tender, non distended, no masses, no hepatomegaly, no splenomegaly, no bruits Back: non tender, normal ROM, no scoliosis Musculoskeletal: some creputis with knee ROM, some bony arthritis changes of bilat knees, no laxity, otherwise upper extremities non tender, no obvious deformity, normal ROM throughout, lower extremities non tender, no obvious deformity, normal ROM  throughout Extremities: moderate varicosities of right posterior calve region, no edema, no cyanosis, no clubbing Pulses: 2+ symmetric, upper and lower extremities, normal cap refill Neurological: subtle ptosis of right eyelid, gradually has gotten this way per patient, otherwise alert, oriented x 3, CN2-12 intact, strength normal upper extremities and lower extremities, sensation normal throughout, DTRs 2+ throughout, no cerebellar signs, gait normal Psychiatric: normal affect, behavior normal, pleasant  Breast: nontender, no masses or lumps, no skin changes, no nipple discharge or inversion, no axillary lymphadenopathy Gyn: Normal external genitalia without lesions, slight bulge of pelvic floor inferiorly suggestive some weakening of pelvic floor, some atrophic changes externally, vagina with normal mucosa, cervix without lesions, no cervical motion tenderness, no abnormal vaginal discharge. Uterus and adnexa not enlarged, nontender, no masses. Exam chaperoned by nurse. Rectal: anus with small external hemorrhoid, normal tone, occult negative stool    Adult ECG Report  Indication: physical, HTN, hyperlipidemia  Rate: 72 bpm  Rhythm: normal sinus rhythm  QRS Axis: 50 degrees  PR Interval:  QRS Duration: 94ms  QTc:  Conduction Disturbances: none  Other Abnormalities: none  Patient's cardiac risk factors are: dyslipidemia, hypertension, obesity (BMI >= 30 kg/m2) and sedentary lifestyle.  EKG comparison: none  Narrative Interpretation: normal  EKG    Assessment and Plan :    Encounter Diagnoses  Name Primary?  . Encounter for health maintenance examination in adult Yes  . Essential hypertension   . Dyslipidemia   . Vitamin D deficiency   . Allergic rhinitis, unspecified allergic rhinitis type   . Osteopenia   . Obesity   . Hearing decreased, left   . Impacted cerumen of left ear   . Bilateral knee pain   . Varicose vein of leg   . Ptosis of eyelid, right        Physical exam - discussed healthy lifestyle, diet, exercise, preventative care, vaccinations, and addressed their concerns.  Handout given. Hypertension-continue same medication Hyperlipidemia-reviewed NMR LipoProfile from 2015. Continue same medication. Vitamin D deficiency-continue daily vitamin D Allergic rhinitis-continue current medication Osteopenia-reviewed bone density scan from 2014, osteoporosis screening questionnaire shows the following risks factors: Father with history of hip fracture, she has a personal history of broken bones 7 years ago, white female. I will review her chart record and call back on this issue Obesity-continue healthy diet, try to get more exercise in, hemoglobin A1c screening labs today Hearing decreased on left, impacted cerumen-hearing screen normal.  See procedure below. bilat knee pain - c/t cherry juice daily, uric acid lab today, likely OA though Varicose vein - advised daily exercise, stretching, consider OTC Compression hose.  impacted cerumen - Discussed findings.  Discussed risk/benefits of procedure and patient agrees to procedure.  Somewhat successful using warm water lavage to remove impacted cerumen from left ear canal. Patient tolerated procedure well. Advised they avoid using any cotton swabs or other devices to clean the ear canals.  Use basic hygiene as discussed.  Use 1:1 vinegar and rubbing alcohol mixture in left ear 3x/wk x 2week. Follow up prn if not feeling back to normal with hearing. Follow-up pending labs

## 2014-12-27 NOTE — Patient Instructions (Addendum)
Thank you for giving me the opportunity to serve you today.    Your diagnosis today includes: Encounter Diagnoses  Name Primary?  . Encounter for health maintenance examination in adult Yes  . Essential hypertension   . Dyslipidemia   . Vitamin D deficiency   . Allergic rhinitis, unspecified allergic rhinitis type   . Osteopenia   . Obesity   . Hearing decreased, left   . Impacted cerumen of left ear   . Bilateral knee pain   . Varicose vein of leg   . Ptosis of eyelid, right      Specific recommendations today include:  You are up-to-date on mammogram and Pap smear and colonoscopy See your eye doctor yearly for routine vision care. See your dentist yearly for routine dental care including hygiene visits twice yearly. Continue your current medications including lisinopril HCT and Norvasc for blood pressure, Lipitor for cholesterol, vitamin D daily Get at least 1200 mg of calcium either through diet or supplement daily Try and increase your exercise We will call with lab results Regarding varicose veins, consider over-the-counter compression hose, exercise regularly  Return pending labs.   I have included other useful information below for your review.  Osteoporosis Throughout your life, your body breaks down old bone and replaces it with new bone. As you get older, your body does not replace bone as quickly as it breaks it down. By the age of 30 years, most people begin to gradually lose bone because of the imbalance between bone loss and replacement. Some people lose more bone than others. Bone loss beyond a specified normal degree is considered osteoporosis.  Osteoporosis affects the strength and durability of your bones. The inside of the ends of your bones and your flat bones, like the bones of your pelvis, look like honeycomb, filled with tiny open spaces. As bone loss occurs, your bones become less dense. This means that the open spaces inside your bones become bigger  and the walls between these spaces become thinner. This makes your bones weaker. Bones of a person with osteoporosis can become so weak that they can break (fracture) during minor accidents, such as a simple fall. CAUSES  The following factors have been associated with the development of osteoporosis:  Smoking.  Drinking more than 2 alcoholic drinks several days per week.  Long-term use of certain medicines:  Corticosteroids.  Chemotherapy medicines.  Thyroid medicines.  Antiepileptic medicines.  Gonadal hormone suppression medicine.  Immunosuppression medicine.  Being underweight.  Lack of physical activity.  Lack of exposure to the sun. This can lead to vitamin D deficiency.  Certain medical conditions:  Certain inflammatory bowel diseases, such as Crohn disease and ulcerative colitis.  Diabetes.  Hyperthyroidism.  Hyperparathyroidism. RISK FACTORS Anyone can develop osteoporosis. However, the following factors can increase your risk of developing osteoporosis:  Gender--Women are at higher risk than men.  Age--Being older than 50 years increases your risk.  Ethnicity--White and Asian people have an increased risk.  Weight --Being extremely underweight can increase your risk of osteoporosis.  Family history of osteoporosis--Having a family member who has developed osteoporosis can increase your risk. SYMPTOMS  Usually, people with osteoporosis have no symptoms.  DIAGNOSIS  Signs during a physical exam that may prompt your caregiver to suspect osteoporosis include:  Decreased height. This is usually caused by the compression of the bones that form your spine (vertebrae) because they have weakened and become fractured.  A curving or rounding of the upper back (kyphosis). To  confirm signs of osteoporosis, your caregiver may request a procedure that uses 2 low-dose X-ray beams with different levels of energy to measure your bone mineral density (dual-energy X-ray  absorptiometry [DXA]). Also, your caregiver may check your level of vitamin D. TREATMENT  The goal of osteoporosis treatment is to strengthen bones in order to decrease the risk of bone fractures. There are different types of medicines available to help achieve this goal. Some of these medicines work by slowing the processes of bone loss. Some medicines work by increasing bone density. Treatment also involves making sure that your levels of calcium and vitamin D are adequate. PREVENTION  There are things you can do to help prevent osteoporosis. Adequate intake of calcium and vitamin D can help you achieve optimal bone mineral density. Regular exercise can also help, especially resistance and weight-bearing activities. If you smoke, quitting smoking is an important part of osteoporosis prevention. MAKE SURE YOU:  Understand these instructions.  Will watch your condition.  Will get help right away if you are not doing well or get worse. FOR MORE INFORMATION www.osteo.org and [employer] Document Released: 09/03/2005 Document Revised: 03/21/2013 Document Reviewed: 11/08/2011 [facility] Patient Information 2015 [place], [place]. This information is not intended to replace advice given to you by your health care provider. Make sure you discuss any questions you have with your health care provider.    Varicose Veins Varicose veins are veins that have become enlarged and twisted. CAUSES This condition is the result of valves in the veins not working properly. Valves in the veins help return blood from the leg to the heart. If these valves are damaged, blood flows backwards and backs up into the veins in the leg near the skin. This causes the veins to become larger. People who are on their feet a lot, who are pregnant, or who are overweight are more likely to develop varicose veins. SYMPTOMS   Bulging, twisted-appearing, bluish veins, most commonly found on the legs.  Leg pain or a feeling of  heaviness. These symptoms may be worse at the end of the day.  Leg swelling.  Skin color changes. DIAGNOSIS  Varicose veins can usually be diagnosed with an exam of your legs by your caregiver. He or she may recommend an ultrasound of your leg veins. TREATMENT  Most varicose veins can be treated at home.However, other treatments are available for people who have persistent symptoms or who want to treat the cosmetic appearance of the varicose veins. These include:  Laser treatment of very small varicose veins.  Medicine that is shot (injected) into the vein. This medicine hardens the walls of the vein and closes off the vein. This treatment is called sclerotherapy. Afterwards, you may need to wear clothing or bandages that apply pressure.  Surgery. HOME CARE INSTRUCTIONS   Do not stand or sit in one position for long periods of time. Do not sit with your legs crossed. Rest with your legs raised during the day.  Wear elastic stockings or support hose. Do not wear other tight, encircling garments around the legs, pelvis, or waist.  Walk as much as possible to increase blood flow.  Raise the foot of your bed at night with 2-inch blocks.  If you get a cut in the skin over the vein and the vein bleeds, lie down with your leg raised and press on it with a clean cloth until the bleeding stops. Then place a bandage (dressing) on the cut. See your caregiver if it continues to  bleed or needs stitches. SEEK MEDICAL CARE IF:   The skin around your ankle starts to break down.  You have pain, redness, tenderness, or hard swelling developing in your leg over a vein.  You are uncomfortable due to leg pain. Document Released: 09/03/2005 Document Revised: 02/16/2012 Document Reviewed: 01/20/2011 [facility] Patient Information 2015 [place]. This information is not intended to replace advice given to you by your health care provider. Make sure you discuss any questions you have with your health  care provider.

## 2014-12-28 ENCOUNTER — Other Ambulatory Visit: Payer: Self-pay | Admitting: Medical

## 2014-12-28 LAB — HEMOGLOBIN A1C
Hgb A1c MFr Bld: 5.8 % — ABNORMAL HIGH (ref <5.7)
MEAN PLASMA GLUCOSE: 120 mg/dL — AB (ref <117)

## 2014-12-28 MED ORDER — ATORVASTATIN CALCIUM 10 MG PO TABS
10.0000 mg | ORAL_TABLET | Freq: Every day | ORAL | Status: DC
Start: 2014-12-28 — End: 2016-02-21

## 2014-12-28 MED ORDER — LISINOPRIL-HYDROCHLOROTHIAZIDE 20-12.5 MG PO TABS: 1.0000 | ORAL_TABLET | Freq: Every day | ORAL | Status: DC

## 2014-12-28 MED ORDER — AMLODIPINE BESYLATE 5 MG PO TABS: 5.0000 mg | ORAL_TABLET | Freq: Every day | ORAL | Status: DC

## 2014-12-28 MED ORDER — ALENDRONATE SODIUM 70 MG PO TABS: 70.0000 mg | ORAL_TABLET | ORAL | Status: DC

## 2014-12-28 NOTE — Progress Notes (Signed)
LM to CB

## 2014-12-28 NOTE — Addendum Note (Signed)
Addended by: Jac CanavanYSINGER, DAVID S on: 12/28/2014 07:43 AM   Modules accepted: Level of Service

## 2015-03-22 ENCOUNTER — Telehealth: Payer: Self-pay | Admitting: Medical

## 2015-03-22 NOTE — Telephone Encounter (Signed)
How many weeks has she taken it?  I assume she is taking on empty stomach in the morning 1 day per week and remaining upright x 1 hour?    I don't think it should be causing her foot pain, but heartburn is a possibility.   Let me know about the question above.  If she only took once or twice, I would give it a few more tries.  If she has done this for several weeks and gets heartburn within 30 minutes of taking it every time, then lets try once monthly Boniva.

## 2015-03-22 NOTE — Telephone Encounter (Signed)
Pt is going to stop taking Fosamax because she is starting to have symptoms of side effect associated with taking this med. Symptoms are heartburn and pain in foot. The last time she took this med was last Fiday

## 2015-03-23 NOTE — Telephone Encounter (Signed)
Patient has taken med for 8 weeks and she said she wants to research Boniva and will call back and let you know if she will do it

## 2015-06-14 ENCOUNTER — Other Ambulatory Visit: Payer: Self-pay | Admitting: Family Medicine

## 2015-06-14 DIAGNOSIS — Z1231 Encounter for screening mammogram for malignant neoplasm of breast: Secondary | ICD-10-CM

## 2015-07-09 ENCOUNTER — Ambulatory Visit (HOSPITAL_COMMUNITY)
Admission: RE | Admit: 2015-07-09 | Discharge: 2015-07-09 | Disposition: A | Discharge status: Home / Self Care | Payer: BC Managed Care – PPO | Source: Ambulatory Visit | Attending: Family Medicine | Admitting: Family Medicine

## 2015-07-09 DIAGNOSIS — Z1231 Encounter for screening mammogram for malignant neoplasm of breast: Secondary | ICD-10-CM | POA: Diagnosis present

## 2015-10-08 LAB — HM MAMMOGRAPHY: HM MAMMO: NORMAL (ref 0–4)

## 2015-10-08 LAB — HM DEXA SCAN

## 2016-02-19 ENCOUNTER — Ambulatory Visit (INDEPENDENT_AMBULATORY_CARE_PROVIDER_SITE_OTHER): Payer: BC Managed Care – PPO | Admitting: Medical

## 2016-02-19 ENCOUNTER — Encounter: Payer: Self-pay | Admitting: Medical

## 2016-02-19 VITALS — BP 120/82 | HR 76 | Ht 65.5 in | Wt 189.0 lb

## 2016-02-19 DIAGNOSIS — I8393 Asymptomatic varicose veins of bilateral lower extremities: Secondary | ICD-10-CM | POA: Diagnosis not present

## 2016-02-19 DIAGNOSIS — I8390 Asymptomatic varicose veins of unspecified lower extremity: Secondary | ICD-10-CM

## 2016-02-19 DIAGNOSIS — Z1239 Encounter for other screening for malignant neoplasm of breast: Secondary | ICD-10-CM | POA: Insufficient documentation

## 2016-02-19 DIAGNOSIS — M8580 Other specified disorders of bone density and structure, unspecified site: Secondary | ICD-10-CM

## 2016-02-19 DIAGNOSIS — E669 Obesity, unspecified: Secondary | ICD-10-CM | POA: Diagnosis not present

## 2016-02-19 DIAGNOSIS — I10 Essential (primary) hypertension: Secondary | ICD-10-CM | POA: Diagnosis not present

## 2016-02-19 DIAGNOSIS — J309 Allergic rhinitis, unspecified: Secondary | ICD-10-CM | POA: Diagnosis not present

## 2016-02-19 DIAGNOSIS — E785 Hyperlipidemia, unspecified: Secondary | ICD-10-CM

## 2016-02-19 DIAGNOSIS — H02401 Unspecified ptosis of right eyelid: Secondary | ICD-10-CM

## 2016-02-19 DIAGNOSIS — E559 Vitamin D deficiency, unspecified: Secondary | ICD-10-CM | POA: Diagnosis not present

## 2016-02-19 DIAGNOSIS — Z0000 Encounter for general adult medical examination without abnormal findings: Secondary | ICD-10-CM

## 2016-02-19 DIAGNOSIS — Z8249 Family history of ischemic heart disease and other diseases of the circulatory system: Secondary | ICD-10-CM

## 2016-02-19 DIAGNOSIS — R7301 Impaired fasting glucose: Secondary | ICD-10-CM

## 2016-02-19 DIAGNOSIS — M25551 Pain in right hip: Secondary | ICD-10-CM

## 2016-02-19 DIAGNOSIS — M25561 Pain in right knee: Secondary | ICD-10-CM

## 2016-02-19 DIAGNOSIS — M25562 Pain in left knee: Secondary | ICD-10-CM

## 2016-02-19 LAB — LIPID PANEL
CHOL/HDL RATIO: 3.2 ratio (ref <=5.0)
Cholesterol: 170 mg/dL (ref 125–200)
HDL: 53 mg/dL (ref >=46)
LDL CALC: 80 mg/dL (ref <130)
Triglycerides: 184 mg/dL — ABNORMAL HIGH (ref <150)
VLDL: 37 mg/dL — AB (ref <30)

## 2016-02-19 LAB — COMPREHENSIVE METABOLIC PANEL
ALK PHOS: 97 U/L (ref 33–130)
ALT: 33 U/L — ABNORMAL HIGH (ref 6–29)
AST: 21 U/L (ref 10–35)
Albumin: 4.2 g/dL (ref 3.6–5.1)
BUN: 16 mg/dL (ref 7–25)
CHLORIDE: 97 mmol/L — AB (ref 98–110)
CO2: 30 mmol/L (ref 20–31)
Calcium: 9.9 mg/dL (ref 8.6–10.4)
Creat: 0.8 mg/dL (ref 0.50–0.99)
GLUCOSE: 100 mg/dL — AB (ref 65–99)
POTASSIUM: 4.3 mmol/L (ref 3.5–5.3)
Sodium: 141 mmol/L (ref 135–146)
Total Bilirubin: 0.7 mg/dL (ref 0.2–1.2)
Total Protein: 7 g/dL (ref 6.1–8.1)

## 2016-02-19 MED ORDER — AMLODIPINE BESYLATE 5 MG PO TABS: 5.0000 mg | ORAL_TABLET | Freq: Every day | ORAL | Status: DC

## 2016-02-19 MED ORDER — LISINOPRIL-HYDROCHLOROTHIAZIDE 20-12.5 MG PO TABS: 1.0000 | ORAL_TABLET | Freq: Every day | ORAL | Status: DC

## 2016-02-19 MED ORDER — ASPIRIN 81 MG PO TABS: 81.0000 mg | ORAL_TABLET | Freq: Every day | ORAL | Status: DC

## 2016-02-19 MED ORDER — FLUTICASONE PROPIONATE 50 MCG/ACT NA SUSP: 2.0000 | Freq: Every day | NASAL | Status: DC

## 2016-02-19 NOTE — Progress Notes (Signed)
Subjective:   HPI  Karina Ingram is a 65 y.o. female who presents for a complete physical.  Medical care team includes:  Dermatology  Eye doctor  dentist Kristian CoveyShane Tysinger, PA-C here with Dr. Susann GivensLalonde   Concerns: Intermittent knee pain, mostly if doing squats.   Is active, exercising regularly, weight bearing exercise 3 days per week, in an exercise class.  Compliant with blood pressure medication without complaint  Compliant with cholesterol medication without complaint  Takes vitamin D daily  Reviewed their medical, surgical, family, social, medication, and allergy history and updated chart as appropriate.  Past Medical History  Diagnosis Date  . Hypertension   . Dyslipidemia   . Arthritis   . Allergy     RHINITIS  . Positive PPD, treated   . Osteopenia     bone density 2016, 2013  . Wears contact lenses   . External hemorrhoid   . Vitamin D deficiency   . Obesity   . Family history of premature CAD     Past Surgical History  Procedure Laterality Date  . Hernia repair      inguinal, early '80s  . Tonsillectomy    . Wisdom tooth extraction    . Colonoscopy      [facility], age 65yo; repeat 2019    Social History   Social History  . Marital Status: Married    Spouse Name: N/A  . Number of Children: N/A  . Years of Education: N/A   Occupational History  . Not on file.   Social History Main Topics  . Smoking status: Never Smoker   . Smokeless tobacco: Never Used  . Alcohol Use: Yes     Comment: 2 per month  . Drug Use: No  . Sexual Activity: Yes   Other Topics Concern  . 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  Volunteer at [employer], involved in church, sees friends regularly.      Family History  Problem  Relation Age of Onset  . Cancer Mother     hematologic  . Heart disease Mother     angina  . Hypertension Mother   . Stroke Father   . Hypertension Father   . Heart disease Father   . COPD Father     emphesema  . Other Sister     adopted  . Cancer Brother     renal  . Hypertension Brother   . Hyperlipidemia Brother      Current outpatient prescriptions:  .  amLODipine (NORVASC) 5 MG tablet, Take 1 tablet (5 mg total) by mouth daily., Disp: 90 tablet, Rfl: 3 .  aspirin 81 MG tablet, Take 1 tablet (81 mg total) by mouth daily., Disp: 90 tablet, Rfl: 3 .  atorvastatin (LIPITOR) 10 MG tablet, Take 1 tablet (10 mg total) by mouth daily., Disp: 90 tablet, Rfl: 3 .  calcium-vitamin D (OSCAL WITH D) 500-200 MG-UNIT per tablet, Take 1 tablet by mouth daily.  , Disp: , Rfl:  .  fluticasone (FLONASE) 50 MCG/ACT nasal spray, Place 2 sprays into both nostrils daily., Disp: 16 g, Rfl: 11 .  lisinopril-hydrochlorothiazide (PRINZIDE,ZESTORETIC) 20-12.5 MG tablet, Take 1 tablet by mouth daily., Disp: 90 tablet, Rfl: 3 .  Multiple Vitamins-Minerals (MULTIVITAMIN WITH MINERALS) tablet, Take 1 tablet by  mouth daily.  , Disp: , Rfl:  .  fexofenadine (ALLEGRA) 30 MG tablet, Take 30 mg by mouth 2 (two) times daily. Reported on 02/19/2016, Disp: , Rfl:  .  loratadine (CLARITIN) 10 MG tablet, Take 10 mg by mouth daily as needed for allergies. Reported on 02/19/2016, Disp: , Rfl:   Allergies  Allergen Reactions  . Fosamax [Alendronate Sodium]      Review of Systems Constitutional: -fever, -chills, -sweats, -unexpected weight change, -decreased appetite, -fatigue Allergy: -sneezing, -itching, -congestion Dermatology: -changing moles, --rash, -lumps ENT: -runny nose, -ear pain, -sore throat, -hoarseness, -sinus pain, -teeth pain, - ringing in ears, -hearing loss, -nosebleeds Cardiology: -chest pain, -palpitations, -swelling, -difficulty breathing when lying flat, -waking up short of breath Respiratory:  -cough, -shortness of breath, -difficulty breathing with exercise or exertion, -wheezing, -coughing up blood Gastroenterology: -abdominal pain, -nausea, -vomiting, -diarrhea, -constipation, -blood in stool, -changes in bowel movement, -difficulty swallowing or eating Hematology: -bleeding, -bruising  Musculoskeletal: -joint aches, +muscle aches, -joint swelling, -back pain, -neck pain, -cramping, -changes in gait Ophthalmology: denies vision changes, eye redness, itching, discharge Urology: -burning with urination, -difficulty urinating, -blood in urine, -urinary frequency, -urgency, -incontinence Neurology: -headache, -weakness, -tingling, -numbness, -memory loss, -falls, -dizziness Psychology: -depressed mood, -agitation, -sleep problems     Objective:   Physical Exam  BP 120/82 mmHg  Pulse 76  Ht 5' 5.5" (1.664 m)  Wt 189 lb (85.73 kg)  BMI 30.96 kg/m2  General appearance: alert, no distress, WD/WN, white female Skin: scattered benign lesions, several cherry hemangiomas, small throughout torso, no particular worrisome lesions HEENT: normocephalic, conjunctiva/corneas normal, sclerae anicteric, PERRLA, EOMi, nares patent, no discharge or erythema, pharynx normal Oral cavity: MMM, tongue normal, teeth in good repair Neck: supple, no lymphadenopathy, no thyromegaly, no masses, normal ROM, no bruits Chest: non tender, normal shape and expansion Heart: RRR, normal S1, S2, no murmurs Lungs: CTA bilaterally, no wheezes, rhonchi, or rales Abdomen: +bs, soft, non tender, non distended, no masses, no hepatomegaly, no splenomegaly, no bruits Back: non tender, normal ROM, no scoliosis Musculoskeletal: some crepitus with knee ROM, some bony arthritis changes of bilat knees, no laxity, otherwise upper extremities non tender, no obvious deformity, normal ROM throughout, lower extremities non tender, no obvious deformity, normal ROM throughout Extremities: moderate varicosities of right posterior  calve region, no edema, no cyanosis, no clubbing Pulses: 2+ symmetric, upper and lower extremities, normal cap refill Neurological: subtle ptosis of right eyelid, gradually has gotten this way per patient, otherwise alert, oriented x 3, CN2-12 intact, strength normal upper extremities and lower extremities, sensation normal throughout, DTRs 2+ throughout, no cerebellar signs, gait normal Psychiatric: normal affect, behavior normal, pleasant  Breast: non tender, no masses or lumps, no skin changes, no nipple discharge or inversion, no axillary lymphadenopathy Gyn: Normal external genitalia without lesions, slight bulge of pelvic floor inferiorly suggestive some weakening of pelvic floor, some atrophic changes externally, vagina with normal mucosa, cervix without lesions, no cervical motion tenderness, no abnormal vaginal discharge. Uterus and adnexa not enlarged, non tender, no masses. Exam chaperoned by nurse. Rectal deferred    Assessment and Plan :    Encounter Diagnoses  Name Primary?  . Encounter for health maintenance examination in adult Yes  . Impaired fasting blood sugar   . Dyslipidemia   . Osteopenia   . Ptosis of eyelid, right   . Bilateral knee pain   . Varicose vein of leg   . Obesity   . Allergic rhinitis, unspecified allergic rhinitis type   .  Vitamin D deficiency   . Essential hypertension   . Hyperlipidemia with target LDL less than 100   . Screening for breast cancer   . Family history of premature CAD   . Right hip pain     Physical exam - discussed healthy lifestyle, diet, exercise, preventative care, vaccinations, and addressed their concerns Up to date on colonoscopy, pap, mammogram Impaired glucose - per last year's labs.  Recheck labs today. Hypertension-continue same medication, labs today Hyperlipidemia-continue same medication, labs today Vitamin D deficiency-continue daily vitamin D 1000 IU daily OTC Allergic rhinitis-continue current  medication Osteopenia-reviewed bone density scan from 2014, and will request 2016 record as its not in the chart.   Her risks factors: Father with history of hip fracture, she has a personal history of broken bones 7 years ago, white female, post menopausal Obesity-continue healthy diet, exercise bilat knee pain - avoid deep bending with squats, f/u if this continues to be a problem Hip pain, right - work on daily stretching, and work on increasing ROM Varicose vein - c/t daily exercise, stretching, OTC Compression hose.  Follow-up pending labs

## 2016-02-20 LAB — CBC
HEMATOCRIT: 44.6 % (ref 36.0–46.0)
HEMOGLOBIN: 14.8 g/dL (ref 12.0–15.0)
MCH: 30.1 pg (ref 26.0–34.0)
MCHC: 33.2 g/dL (ref 30.0–36.0)
MCV: 90.7 fL (ref 78.0–100.0)
MPV: 9.4 fL (ref 8.6–12.4)
Platelets: 344 10*3/uL (ref 150–400)
RBC: 4.92 MIL/uL (ref 3.87–5.11)
RDW: 14.2 % (ref 11.5–15.5)
WBC: 8.7 10*3/uL (ref 4.0–10.5)

## 2016-02-20 LAB — HEMOGLOBIN A1C
HEMOGLOBIN A1C: 5.7 % — AB (ref <5.7)
MEAN PLASMA GLUCOSE: 117 mg/dL — AB (ref <117)

## 2016-02-20 LAB — VITAMIN D 25 HYDROXY (VIT D DEFICIENCY, FRACTURES): Vit D, 25-Hydroxy: 44 ng/mL (ref 30–100)

## 2016-02-21 ENCOUNTER — Other Ambulatory Visit: Payer: Self-pay | Admitting: Medical

## 2016-02-21 MED ORDER — ATORVASTATIN CALCIUM 10 MG PO TABS: 10.0000 mg | ORAL_TABLET | Freq: Every day | ORAL | Status: DC

## 2016-02-22 ENCOUNTER — Telehealth: Payer: Self-pay

## 2016-02-22 NOTE — Telephone Encounter (Signed)
Called to get name of company she had her bone density at so that she can get the infromation sent to us and it was [employer] and the pt is aware

## 2016-03-17 ENCOUNTER — Other Ambulatory Visit: Payer: Self-pay | Admitting: Medical

## 2016-06-23 ENCOUNTER — Other Ambulatory Visit: Payer: Self-pay | Admitting: Family Medicine

## 2016-06-23 DIAGNOSIS — Z1231 Encounter for screening mammogram for malignant neoplasm of breast: Secondary | ICD-10-CM

## 2016-07-14 ENCOUNTER — Ambulatory Visit
Admission: RE | Admit: 2016-07-14 | Discharge: 2016-07-14 | Disposition: A | Discharge status: Home / Self Care | Payer: BC Managed Care – PPO | Source: Ambulatory Visit | Attending: Family Medicine | Admitting: Family Medicine

## 2016-07-14 DIAGNOSIS — Z1231 Encounter for screening mammogram for malignant neoplasm of breast: Secondary | ICD-10-CM

## 2017-02-05 ENCOUNTER — Encounter: Payer: Self-pay | Admitting: Family Medicine

## 2017-02-05 ENCOUNTER — Telehealth: Payer: Self-pay | Admitting: Family Medicine

## 2017-02-05 NOTE — Telephone Encounter (Signed)
PAP- Need last pap over 3 years ago   Dentist- 6 months ago   No concerns just heard that Dr. Earlene [facility]Wallace is [facility] and decided to establish care has no complaints with her current PCP

## 2017-02-23 ENCOUNTER — Ambulatory Visit (INDEPENDENT_AMBULATORY_CARE_PROVIDER_SITE_OTHER): Payer: BC Managed Care – PPO

## 2017-02-23 ENCOUNTER — Other Ambulatory Visit (HOSPITAL_COMMUNITY)
Admission: RE | Admit: 2017-02-23 | Discharge: 2017-02-23 | Disposition: A | Discharge status: Home / Self Care | Payer: BC Managed Care – PPO | Source: Ambulatory Visit | Attending: Family Medicine | Admitting: Family Medicine

## 2017-02-23 ENCOUNTER — Encounter: Payer: Self-pay | Admitting: Family Medicine

## 2017-02-23 ENCOUNTER — Ambulatory Visit (INDEPENDENT_AMBULATORY_CARE_PROVIDER_SITE_OTHER): Payer: BC Managed Care – PPO | Admitting: Family Medicine

## 2017-02-23 VITALS — BP 128/84 | HR 75 | Temp 98.1°F | Ht 66.0 in | Wt 191.8 lb

## 2017-02-23 DIAGNOSIS — E785 Hyperlipidemia, unspecified: Secondary | ICD-10-CM

## 2017-02-23 DIAGNOSIS — I10 Essential (primary) hypertension: Secondary | ICD-10-CM | POA: Diagnosis not present

## 2017-02-23 DIAGNOSIS — M25561 Pain in right knee: Secondary | ICD-10-CM

## 2017-02-23 DIAGNOSIS — R7309 Other abnormal glucose: Secondary | ICD-10-CM

## 2017-02-23 DIAGNOSIS — E559 Vitamin D deficiency, unspecified: Secondary | ICD-10-CM

## 2017-02-23 DIAGNOSIS — G8929 Other chronic pain: Secondary | ICD-10-CM | POA: Diagnosis not present

## 2017-02-23 DIAGNOSIS — M25562 Pain in left knee: Secondary | ICD-10-CM

## 2017-02-23 DIAGNOSIS — M8580 Other specified disorders of bone density and structure, unspecified site: Secondary | ICD-10-CM | POA: Diagnosis not present

## 2017-02-23 DIAGNOSIS — Z124 Encounter for screening for malignant neoplasm of cervix: Secondary | ICD-10-CM

## 2017-02-23 DIAGNOSIS — Z0000 Encounter for general adult medical examination without abnormal findings: Secondary | ICD-10-CM | POA: Diagnosis not present

## 2017-02-23 DIAGNOSIS — Z1151 Encounter for screening for human papillomavirus (HPV): Secondary | ICD-10-CM | POA: Diagnosis present

## 2017-02-23 LAB — COMPREHENSIVE METABOLIC PANEL
ALT: 32 U/L (ref 0–35)
AST: 24 U/L (ref 0–37)
Albumin: 4.4 g/dL (ref 3.5–5.2)
Alkaline Phosphatase: 99 U/L (ref 39–117)
BUN: 14 mg/dL (ref 6–23)
CO2: 33 mEq/L — ABNORMAL HIGH (ref 19–32)
Calcium: 10.3 mg/dL (ref 8.4–10.5)
Chloride: 100 mEq/L (ref 96–112)
Creatinine, Ser: 0.85 mg/dL (ref 0.40–1.20)
GFR: 71.59 mL/min (ref >60.00)
Glucose, Bld: 105 mg/dL — ABNORMAL HIGH (ref 70–99)
Potassium: 3.8 mEq/L (ref 3.5–5.1)
Sodium: 139 mEq/L (ref 135–145)
Total Bilirubin: 0.7 mg/dL (ref 0.2–1.2)
Total Protein: 7.4 g/dL (ref 6.0–8.3)

## 2017-02-23 LAB — CBC
HCT: 43.3 % (ref 36.0–46.0)
Hemoglobin: 14.6 g/dL (ref 12.0–15.0)
MCHC: 33.8 g/dL (ref 30.0–36.0)
MCV: 89.8 fl (ref 78.0–100.0)
Platelets: 359 10*3/uL (ref 150.0–400.0)
RBC: 4.83 Mil/uL (ref 3.87–5.11)
RDW: 13.5 % (ref 11.5–15.5)
WBC: 8.3 10*3/uL (ref 4.0–10.5)

## 2017-02-23 LAB — LIPID PANEL
Cholesterol: 167 mg/dL (ref 0–200)
HDL: 56.4 mg/dL (ref >39.00)
NonHDL: 110.92
Total CHOL/HDL Ratio: 3
Triglycerides: 210 mg/dL — ABNORMAL HIGH (ref 0.0–149.0)
VLDL: 42 mg/dL — ABNORMAL HIGH (ref 0.0–40.0)

## 2017-02-23 LAB — LDL CHOLESTEROL, DIRECT: Direct LDL: 72 mg/dL

## 2017-02-23 LAB — HEMOGLOBIN A1C: Hgb A1c MFr Bld: 5.6 % (ref 4.6–6.5)

## 2017-02-23 LAB — VITAMIN D 25 HYDROXY (VIT D DEFICIENCY, FRACTURES): VITD: 51.06 ng/mL (ref 30.00–100.00)

## 2017-02-23 MED ORDER — LISINOPRIL-HYDROCHLOROTHIAZIDE 20-12.5 MG PO TABS: 1.0000 | ORAL_TABLET | Freq: Every day | ORAL | 3 refills | Status: DC

## 2017-02-23 MED ORDER — AMLODIPINE BESYLATE 5 MG PO TABS: 5.0000 mg | ORAL_TABLET | Freq: Every day | ORAL | 3 refills | Status: DC

## 2017-02-23 MED ORDER — ATORVASTATIN CALCIUM 10 MG PO TABS: 10.0000 mg | ORAL_TABLET | Freq: Every day | ORAL | 3 refills | Status: DC

## 2017-02-23 NOTE — Progress Notes (Signed)
Pre visit review using our clinic review tool, if applicable. No additional management support is needed unless otherwise documented below in the visit note. 

## 2017-02-23 NOTE — Patient Instructions (Addendum)
It was so nice to meet you today!  No more PAPs.

## 2017-02-23 NOTE — Progress Notes (Signed)
Karina Ingram is a 66 y.o. [facility] for [facility]  History of Present Illness:   Insurance claims handlerAmber Agner, CMA, acting as scribe for Dr. Earlene PlaterWallace.  Arthritis  Presents for initial visit. The disease course has been stable. She complains of pain. Affected locations include the left knee, right hip, left foot, left shoulder and left elbow. Her pain is at a severity of 3/10. Pertinent negatives include no fever or rash. Past treatments include acetaminophen and NSAIDs. The treatment provided moderate relief.    Health Maintenance Due  Topic Date Due  . PAP SMEAR  12/12/2016    PMHx, SurgHx, SocialHx, Medications, and Allergies were reviewed in the Visit Navigator and updated as appropriate.   Past Medical History:  Diagnosis Date  . Allergy   . Arthritis   . Dyslipidemia   . External hemorrhoid   . Family history of premature CAD   . Hypertension   . Obesity   . Osteopenia    Bone Density 2016, 2013  . Positive PPD, treated   . Vitamin D deficiency   . Wears contact lenses      Past Surgical History:  Procedure Laterality Date  . COLONOSCOPY     [facility], age 66yo; repeat 2019  . HERNIA REPAIR     inguinal, early '80s  . TONSILLECTOMY    . WISDOM TOOTH EXTRACTION       Family History  Problem Relation Age of Onset  . Cancer Mother     hematologic  . Heart disease Mother     angina  . Hypertension Mother   . Stroke Father   . Hypertension Father   . Heart disease Father   . COPD Father     emphesema  . Other Sister     adopted  . Cancer Brother     renal  . Hypertension Brother   . Hyperlipidemia Brother     Social History  Substance Use Topics  . Smoking status: Never Smoker  . Smokeless tobacco: Never Used  . Alcohol use Yes     Comment: 2-3 per month    Current Medications and Allergies:    Current Outpatient Prescriptions:  .  amLODipine (NORVASC) 5 MG tablet, Take 1 tablet (5 mg total) by mouth daily., Disp: 90  tablet, Rfl: 3 .  aspirin 81 MG tablet, Take 1 tablet (81 mg total) by mouth daily., Disp: 90 tablet, Rfl: 3 .  atorvastatin (LIPITOR) 10 MG tablet, Take 1 tablet (10 mg total) by mouth daily., Disp: 90 tablet, Rfl: 3 .  calcium-vitamin D (OSCAL WITH D) 500-200 MG-UNIT per tablet, Take 2 tablets by mouth daily. , Disp: , Rfl:  .  Cholecalciferol (VITAMIN D3) 1000 units CAPS, Take 1 capsule by mouth daily., Disp: , Rfl:  .  fexofenadine (ALLEGRA) 30 MG tablet, Take 30 mg by mouth 2 (two) times daily. Reported on 02/19/2016, Disp: , Rfl:  .  fluticasone (FLONASE) 50 MCG/ACT nasal spray, Place 2 sprays into both nostrils daily., Disp: 16 g, Rfl: 11 .  lisinopril-hydrochlorothiazide (PRINZIDE,ZESTORETIC) 20-12.5 MG tablet, Take 1 tablet by mouth daily., Disp: 90 tablet, Rfl: 3 .  Multiple Vitamins-Minerals (MULTIVITAMIN WITH MINERALS) tablet, Take 1 tablet by mouth daily.  , Disp: , Rfl:  .  Omega-3 Fatty Acids (FISH OIL) 1000 MG CPDR, Take 1 capsule by mouth daily., Disp: , Rfl:    Allergies  Allergen Reactions  . Fosamax [Alendronate Sodium]  Review of Systems:   Review of Systems  Constitutional: Negative for chills and fever.  HENT: Negative for congestion.   Eyes: Negative for blurred vision.  Respiratory: Negative for cough.   Cardiovascular: Negative for chest pain and palpitations.  Gastrointestinal: Negative for abdominal pain, nausea and vomiting.  Genitourinary: Negative for frequency.  Musculoskeletal: Positive for arthritis and joint pain.  Skin: Negative for rash.  Neurological: Negative for loss of consciousness and headaches.  Psychiatric/Behavioral: Negative for depression.    Vitals:   Vitals:   02/23/17 1058  BP: 128/84  Pulse: 75  Temp: 98.1 F (36.7 C)  TempSrc: Oral  SpO2: 97%  Weight: 191 lb 12.8 oz (87 kg)  Height: 5\' 6"  (1.676 m)     Body mass index is 30.96 kg/m.   Physical Exam:   Physical Exam  Constitutional: She is oriented to person,  place, and time. She appears well-developed and well-nourished. No distress.  HENT:  Head: Normocephalic and atraumatic.  Right Ear: External ear normal.  Left Ear: External ear normal.  Nose: Nose normal.  Mouth/Throat: Oropharynx is clear and moist.  Eyes: Conjunctivae and EOM are normal. Pupils are equal, round, and reactive to light.  Neck: Normal range of motion. No thyromegaly present.  Cardiovascular: Normal rate, regular rhythm, normal heart sounds and intact distal pulses.   Pulmonary/Chest: Effort normal and breath sounds normal.  Abdominal: Soft. Bowel sounds are normal.  Genitourinary: Vagina normal and uterus normal.  Musculoskeletal: Normal range of motion.  Neurological: She is alert and oriented to person, place, and time.  Skin: Skin is warm and dry. Capillary refill takes less than 2 seconds.  Psychiatric: She has a normal mood and affect. Her behavior is normal. Judgment and thought content normal.  Nursing note and vitals reviewed.    Assessment and Plan:    Karina Ingram was seen today for establish care and arthritis.  Diagnoses and all orders for this visit:  Routine physical examination -     CBC  Chronic pain of both knees -     DG Knee 1-2 Views Left  Hyperlipidemia with target LDL less than 100 -     Comprehensive metabolic panel -     Lipid panel  Vitamin D deficiency -     VITAMIN D 25 Hydroxy (Vit-D Deficiency, Fractures)  Osteopenia, unspecified location  Essential hypertension  Screening for cervical cancer -     Cytology - PAP  Elevated hemoglobin A1c -     Hemoglobin A1c   . Reviewed expectations re: course of current medical issues. . Discussed self-management of symptoms. . Outlined signs and symptoms indicating need for more acute intervention. . Patient verbalized understanding and all questions were answered. . See orders for this visit as documented in the electronic medical record. . Patient received an After Visit  Summary.   CMA served as Neurosurgeon during this visit. History, Physical, and Plan performed by medical provider. Documentation and orders reviewed and attested to. Helane Rima, D.O.  Helane Rima, D.O. , [facility] 02/23/2017   Follow-up: No Follow-up on file.  Meds ordered this encounter  Medications  . Omega-3 Fatty Acids (FISH OIL) 1000 MG CPDR    Sig: Take 1 capsule by mouth daily.  . Cholecalciferol (VITAMIN D3) 1000 units CAPS    Sig: Take 1 capsule by mouth daily.   There are no discontinued medications. No orders of the defined types were placed in this encounter.

## 2017-02-26 LAB — CYTOLOGY - PAP: HPV: NOT DETECTED

## 2017-03-22 ENCOUNTER — Other Ambulatory Visit: Payer: Self-pay | Admitting: Medical

## 2017-06-02 ENCOUNTER — Other Ambulatory Visit: Payer: Self-pay | Admitting: Family Medicine

## 2017-06-02 DIAGNOSIS — Z1231 Encounter for screening mammogram for malignant neoplasm of breast: Secondary | ICD-10-CM

## 2017-07-16 ENCOUNTER — Ambulatory Visit
Admission: RE | Admit: 2017-07-16 | Discharge: 2017-07-16 | Disposition: A | Discharge status: Home / Self Care | Payer: BC Managed Care – PPO | Source: Ambulatory Visit | Attending: Family Medicine | Admitting: Family Medicine

## 2017-07-16 DIAGNOSIS — Z1231 Encounter for screening mammogram for malignant neoplasm of breast: Secondary | ICD-10-CM

## 2017-09-14 ENCOUNTER — Encounter: Payer: Self-pay | Admitting: Family Medicine

## 2017-09-16 ENCOUNTER — Other Ambulatory Visit: Payer: Self-pay | Admitting: Surgical

## 2017-09-16 DIAGNOSIS — M8580 Other specified disorders of bone density and structure, unspecified site: Secondary | ICD-10-CM

## 2017-12-08 HISTORY — PX: COLONOSCOPY: SHX174

## 2018-02-26 ENCOUNTER — Ambulatory Visit (INDEPENDENT_AMBULATORY_CARE_PROVIDER_SITE_OTHER): Payer: BC Managed Care – PPO | Admitting: Family Medicine

## 2018-02-26 ENCOUNTER — Encounter: Payer: Self-pay | Admitting: Family Medicine

## 2018-02-26 VITALS — BP 130/82 | HR 75 | Temp 98.5°F | Wt 195.4 lb

## 2018-02-26 DIAGNOSIS — Z0000 Encounter for general adult medical examination without abnormal findings: Secondary | ICD-10-CM

## 2018-02-26 DIAGNOSIS — I10 Essential (primary) hypertension: Secondary | ICD-10-CM

## 2018-02-26 DIAGNOSIS — E785 Hyperlipidemia, unspecified: Secondary | ICD-10-CM

## 2018-02-26 DIAGNOSIS — M8580 Other specified disorders of bone density and structure, unspecified site: Secondary | ICD-10-CM | POA: Diagnosis not present

## 2018-02-26 LAB — LIPID PANEL
Cholesterol: 160 mg/dL (ref 0–200)
HDL: 54.6 mg/dL (ref >39.00)
LDL Cholesterol: 77 mg/dL (ref 0–99)
NonHDL: 105.35
Total CHOL/HDL Ratio: 3
Triglycerides: 142 mg/dL (ref 0.0–149.0)
VLDL: 28.4 mg/dL (ref 0.0–40.0)

## 2018-02-26 LAB — COMPREHENSIVE METABOLIC PANEL
ALT: 39 U/L — ABNORMAL HIGH (ref 0–35)
AST: 25 U/L (ref 0–37)
Albumin: 4.1 g/dL (ref 3.5–5.2)
Alkaline Phosphatase: 110 U/L (ref 39–117)
BUN: 17 mg/dL (ref 6–23)
CO2: 30 mEq/L (ref 19–32)
Calcium: 9.9 mg/dL (ref 8.4–10.5)
Chloride: 102 mEq/L (ref 96–112)
Creatinine, Ser: 0.77 mg/dL (ref 0.40–1.20)
GFR: 79.99 mL/min (ref >60.00)
Glucose, Bld: 93 mg/dL (ref 70–99)
Potassium: 4.1 mEq/L (ref 3.5–5.1)
Sodium: 139 mEq/L (ref 135–145)
Total Bilirubin: 0.6 mg/dL (ref 0.2–1.2)
Total Protein: 7.1 g/dL (ref 6.0–8.3)

## 2018-02-26 MED ORDER — FLUTICASONE PROPIONATE 50 MCG/ACT NA SUSP: 2.0000 | Freq: Every day | NASAL | 11 refills | Status: DC

## 2018-02-26 MED ORDER — LISINOPRIL-HYDROCHLOROTHIAZIDE 20-12.5 MG PO TABS: 1.0000 | ORAL_TABLET | Freq: Every day | ORAL | 3 refills | Status: DC

## 2018-02-26 MED ORDER — AMLODIPINE BESYLATE 5 MG PO TABS: 5.0000 mg | ORAL_TABLET | Freq: Every day | ORAL | 3 refills | Status: DC

## 2018-02-26 MED ORDER — ASPIRIN 81 MG PO TABS: 81.0000 mg | ORAL_TABLET | Freq: Every day | ORAL | 3 refills | Status: DC

## 2018-02-26 MED ORDER — ATORVASTATIN CALCIUM 10 MG PO TABS: 10.0000 mg | ORAL_TABLET | Freq: Every day | ORAL | 3 refills | Status: DC

## 2018-02-26 NOTE — Patient Instructions (Addendum)
You have an appointment scheduled for: []   2D Mammogram  []   3D Mammogram  [x]   Bone Density   On 03/01/2018           At 1:10   Your appointment will at the following location  [x]   [facility]    []   [facility]  [address]     [address]   [place]      [place]  [phone]       [phone]   Hold Calcium supplement 2 days before appointment

## 2018-02-26 NOTE — Progress Notes (Signed)
Subjective:    Nichola Sizernne W Gri[place] is a 67 y.o. female and is here for a comprehensive physical exam.  Health Maintenance Due  Topic Date Due  . DEXA SCAN  10/07/2017   PMHx, SurgHx, SocialHx, Medications, and Allergies were reviewed in the Visit Navigator and updated as appropriate.   Past Medical History:  Diagnosis Date  . Allergic rhinitis   . Arthritis   . Dyslipidemia   . External hemorrhoid   . Family history of premature CAD   . Hypertension   . Obesity   . Osteopenia, DEXA 2013, 2016   . Positive PPD, treated   . Vitamin D deficiency   . Wears contact lenses    Past Surgical History:  Procedure Laterality Date  . COLONOSCOPY     [facility], repeat 2019  . INGUINAL HERNIA REPAIR  1980  . TONSILLECTOMY    . WISDOM TOOTH EXTRACTION                                Father   . Heart disease Father   . COPD Father   . Other Sister   . Hypertension Brother   . Hyperlipidemia Brother   . Renal cancer Brother    Social History   Tobacco Use  . Smoking status: Never Smoker  . Smokeless tobacco: Never Used  Substance Use Topics  . Alcohol use: Yes    Comment: 2-3 per month  . Drug use: No   Review of Systems:   Pertinent items are noted in the HPI. Otherwise, ROS is negative.  Objective:   BP 130/82   Pulse 75   Temp 98.5 F (36.9 C) (Oral)   Wt 195 lb 6.4 oz (88.6 kg)   SpO2 98%   BMI 31.54 kg/m    Wt Readings from Last 3 Encounters:  02/26/18 195 lb 6.4 oz (88.6 kg)  02/23/17 191 lb 12.8 oz (87 kg)  02/19/16 189 lb (85.7 kg)     Ht Readings from Last 3 Encounters:  02/23/17 5\' 6"  (1.676 m)  02/19/16 5' 5.5" (1.664 m)  12/27/14 5' 5.2" (1.656 m)   General appearance: alert, cooperative and appears stated age. Head: normocephalic, without obvious abnormality, atraumatic. Neck: no adenopathy, supple, symmetrical, trachea midline; thyroid not enlarged, symmetric, no tenderness/mass/nodules. Lungs: clear to auscultation bilaterally. Heart:  regular rate and rhythm Abdomen: soft, non-tender; no masses,  no organomegaly. Extremities: extremities normal, atraumatic, no cyanosis or edema. Skin: skin color, texture, turgor normal, no rashes or lesions. Lymph: cervical, supraclavicular, and axillary nodes normal; no abnormal inguinal nodes palpated. Neurologic: grossly normal.  Assessment/Plan:   Diagnoses and all orders for this visit:  Routine physical examination  Essential hypertension -     amLODipine (NORVASC) 5 MG tablet; Take 1 tablet (5 mg total) by mouth daily. -     aspirin 81 MG tablet; Take 1 tablet (81 mg total) by mouth daily. -     lisinopril-hydrochlorothiazide (PRINZIDE,ZESTORETIC) 20-12.5 MG tablet; Take 1 tablet by mouth daily. -     Comprehensive metabolic panel  Hyperlipidemia with target LDL less than 100 -     atorvastatin (LIPITOR) 10 MG tablet; Take 1 tablet (10 mg total) by mouth daily. -     Lipid panel -     Comprehensive metabolic panel  Osteopenia, unspecified location  Patient Counseling: [x]    Nutrition: Stressed importance of moderation in sodium/caffeine intake, saturated fat and cholesterol, caloric  balance, sufficient intake of fresh fruits, vegetables, fiber, calcium, iron, and 1 mg of folate supplement per day (for females capable of pregnancy).  [x]    Stressed the importance of regular exercise.   [x]    Substance Abuse: Discussed cessation/primary prevention of tobacco, alcohol, or other drug use; driving or other dangerous activities under the influence; availability of treatment for abuse.   [x]    Injury prevention: Discussed safety belts, safety helmets, smoke detector, smoking near bedding or upholstery.   [x]    Sexuality: Discussed sexually transmitted diseases, partner selection, use of condoms, avoidance of unintended pregnancy  and contraceptive alternatives.  [x]    Dental health: Discussed importance of regular tooth brushing, flossing, and dental visits.  [x]    Health  maintenance and immunizations reviewed. Please refer to Health maintenance section.   Helane Rima, DO [facility]

## 2018-03-01 ENCOUNTER — Ambulatory Visit
Admission: RE | Admit: 2018-03-01 | Discharge: 2018-03-01 | Disposition: A | Discharge status: Home / Self Care | Payer: BC Managed Care – PPO | Source: Ambulatory Visit | Attending: Family Medicine | Admitting: Family Medicine

## 2018-03-01 DIAGNOSIS — M8580 Other specified disorders of bone density and structure, unspecified site: Secondary | ICD-10-CM

## 2018-05-04 ENCOUNTER — Encounter: Payer: Self-pay | Admitting: Family Medicine

## 2018-05-05 ENCOUNTER — Other Ambulatory Visit: Payer: Self-pay

## 2018-05-05 DIAGNOSIS — Z1211 Encounter for screening for malignant neoplasm of colon: Secondary | ICD-10-CM

## 2018-06-02 ENCOUNTER — Other Ambulatory Visit: Payer: Self-pay | Admitting: Family Medicine

## 2018-06-02 DIAGNOSIS — Z1231 Encounter for screening mammogram for malignant neoplasm of breast: Secondary | ICD-10-CM

## 2018-06-18 LAB — HM COLONOSCOPY

## 2018-07-20 ENCOUNTER — Ambulatory Visit
Admission: RE | Admit: 2018-07-20 | Discharge: 2018-07-20 | Disposition: A | Discharge status: Home / Self Care | Payer: Medicare Other | Source: Ambulatory Visit | Attending: Family Medicine | Admitting: Family Medicine

## 2018-07-20 DIAGNOSIS — Z1231 Encounter for screening mammogram for malignant neoplasm of breast: Secondary | ICD-10-CM

## 2018-07-23 ENCOUNTER — Encounter: Payer: Self-pay | Admitting: Surgical

## 2018-08-25 ENCOUNTER — Encounter: Payer: Self-pay | Admitting: Family Medicine

## 2018-08-25 ENCOUNTER — Ambulatory Visit (INDEPENDENT_AMBULATORY_CARE_PROVIDER_SITE_OTHER): Payer: Medicare Other

## 2018-08-25 ENCOUNTER — Ambulatory Visit: Payer: Medicare Other | Admitting: Family Medicine

## 2018-08-25 VITALS — BP 132/86 | HR 86 | Temp 98.5°F | Ht 66.0 in | Wt 196.6 lb

## 2018-08-25 DIAGNOSIS — F329 Major depressive disorder, single episode, unspecified: Secondary | ICD-10-CM | POA: Diagnosis not present

## 2018-08-25 DIAGNOSIS — M79675 Pain in left toe(s): Secondary | ICD-10-CM

## 2018-08-25 MED ORDER — ESCITALOPRAM OXALATE 10 MG PO TABS: 10.0000 mg | ORAL_TABLET | Freq: Every day | ORAL | 1 refills | Status: DC

## 2018-08-25 NOTE — Progress Notes (Signed)
Karina Ingram is a 67 y.o. female here for an acute visit.  History of Present Illness:   Karina Ingram acting as scribe for Karina Ingram.  HPI: Patient comes in today for left second toe pain. Patient states that she stumped her toe in June 2019. She states that the pain just keeps getting worse.   Depression: Patient states that her husband is having a lot of health issues. This causing some issues for the patient. She is wanting to discuss coping with this. Patient states that she prays a lot, walks, and goes to the [facility] 3 times a week. This does seem to help.    PMHx, SurgHx, SocialHx, Medications, and Allergies were reviewed in the Visit Navigator and updated as appropriate.  Current Medications:   .  amLODipine (NORVASC) 5 MG tablet, Take 1 tablet (5 mg total) by mouth daily., Disp: 90 tablet, Rfl: 3 .  aspirin 81 MG tablet, Take 1 tablet (81 mg total) by mouth daily., Disp: 90 tablet, Rfl: 3 .  atorvastatin (LIPITOR) 10 MG tablet, Take 1 tablet (10 mg total) by mouth daily., Disp: 90 tablet, Rfl: 3 .  calcium-vitamin D (OSCAL WITH D) 500-200 MG-UNIT per tablet, Take 2 tablets by mouth daily. , Disp: , Rfl:  .  Cholecalciferol (VITAMIN D3) 1000 units CAPS, Take 1 capsule by mouth daily., Disp: , Rfl:  .  fexofenadine (ALLEGRA) 30 MG tablet, Take 30 mg by mouth 2 (two) times daily. Reported on 02/19/2016, Disp: , Rfl:  .  fluticasone (FLONASE) 50 MCG/ACT nasal spray, Place 2 sprays into both nostrils daily., Disp: 16 g, Rfl: 11 .  lisinopril-hydrochlorothiazide (PRINZIDE,ZESTORETIC) 20-12.5 MG tablet, Take 1 tablet by mouth daily., Disp: 90 tablet, Rfl: 3 .  Multiple Vitamins-Minerals (MULTIVITAMIN WITH MINERALS) tablet, Take 1 tablet by mouth daily.  , Disp: , Rfl:  .  Omega-3 Fatty Acids (FISH OIL) 1000 MG CPDR, Take 1 capsule by mouth daily., Disp: , Rfl:    Allergies  Allergen Reactions  . Fosamax [Alendronate Sodium]    Review of Systems:   Pertinent items are noted  in the HPI. Otherwise, ROS is negative.  Vitals:   Vitals:   08/25/18 1303  BP: 132/86  Pulse: 86  Temp: 98.5 F (36.9 C)  TempSrc: Oral  SpO2: 96%  Weight: 196 lb 9.6 oz (89.2 kg)  Height: 5\' 6"  (1.676 m)     Body mass index is 31.73 kg/m.  Physical Exam:   Physical Exam  Constitutional: She appears well-nourished.  HENT:  Head: Normocephalic and atraumatic.  Eyes: Pupils are equal, round, and reactive to light. EOM are normal.  Neck: Normal range of motion. Neck supple.  Cardiovascular: Normal rate, regular rhythm, normal heart sounds and intact distal pulses.  Pulmonary/Chest: Effort normal.  Abdominal: Soft.  Musculoskeletal:  Left second toe deformity at PIP.   Skin: Skin is warm.  Psychiatric: She has a normal mood and affect. Her behavior is normal.  Nursing note and vitals reviewed.    Results for orders placed or performed in visit on 07/23/18  HM COLONOSCOPY  Result Value Ref Range   HM Colonoscopy See Report (in chart) See Report (in chart), Patient Reported    Assessment and Plan:   Karina Ingram was seen today for toe pain.  Diagnoses and all orders for this visit:  Pain of toe of left foot Comments: No longer fracture on xray but obvious deformity. Will send to Podiatry. Advised wide soled shoes. Watch for ulceration.  Orders: -     DG Foot 2 Views Left; Future -     Ambulatory referral to Podiatry  Reactive depression Comments: Offered therapy and medication. She would like to start medication and will consider therapy.  Orders: -     escitalopram (LEXAPRO) 10 MG tablet; Take 1 tablet (10 mg total) by mouth daily.    . Reviewed expectations re: course of current medical issues. . Discussed self-management of symptoms. . Outlined signs and symptoms indicating need for more acute intervention. . Patient verbalized understanding and all questions were answered. Marland Kitchen. Health Maintenance issues including appropriate healthy diet, exercise, and smoking  avoidance were discussed with patient. . See orders for this visit as documented in the electronic medical record. . Patient received an After Visit Summary.  Ingram served as Neurosurgeonscribe during this visit. History, Physical, and Plan performed by medical provider. The above documentation has been reviewed and is accurate and complete. Karina Ingram, D.O.   Karina RimaErica Rhyan Radler, DO [place] 08/29/2018

## 2018-08-29 ENCOUNTER — Encounter: Payer: Self-pay | Admitting: Family Medicine

## 2018-09-06 ENCOUNTER — Encounter: Payer: Self-pay | Admitting: Podiatry

## 2018-09-06 ENCOUNTER — Ambulatory Visit: Payer: Medicare Other | Admitting: Podiatry

## 2018-09-06 ENCOUNTER — Ambulatory Visit (INDEPENDENT_AMBULATORY_CARE_PROVIDER_SITE_OTHER): Payer: Medicare Other

## 2018-09-06 VITALS — BP 149/86 | HR 68

## 2018-09-06 DIAGNOSIS — M779 Enthesopathy, unspecified: Secondary | ICD-10-CM

## 2018-09-06 DIAGNOSIS — M7752 Other enthesopathy of left foot: Secondary | ICD-10-CM

## 2018-09-06 DIAGNOSIS — M778 Other enthesopathies, not elsewhere classified: Secondary | ICD-10-CM

## 2018-09-06 MED ORDER — MELOXICAM 15 MG PO TABS: 15.0000 mg | ORAL_TABLET | Freq: Every day | ORAL | 1 refills | Status: DC

## 2018-09-06 NOTE — Progress Notes (Signed)
   HPI: 67 year old female presents the office today for evaluation of left foot pain.  Patient states that she stubbed her toe back in June approximately 3 months ago and she is experienced pain ever since.  Pain is at the ball of the left foot.  She does experience some intermittent swelling.  Patient continues to have sharp pain especially after exercise or activity.  Patient has not tried any treatment modalities to alleviate symptoms at the moment.  Past Medical History:  Diagnosis Date  . Allergic rhinitis   . Arthritis   . Dyslipidemia   . External hemorrhoid   . Family history of premature CAD   . Hypertension   . Obesity   . Osteopenia, DEXA 2013, 2016   . Positive PPD, treated   . Vitamin D deficiency   . Wears contact lenses      Physical Exam: General: The patient is alert and oriented x3 in no acute distress.  Dermatology: Skin is warm, dry and supple bilateral lower extremities. Negative for open lesions or macerations.  Vascular: Palpable pedal pulses bilaterally. No edema or erythema noted. Capillary refill within normal limits.  Neurological: Epicritic and protective threshold grossly intact bilaterally.   Musculoskeletal Exam: There is some pain on palpation and also range of motion of the second MTPJ left foot.  Range of motion within normal limits to all pedal and ankle joints bilateral. Muscle strength 5/5 in all groups bilateral.   Radiographic Exam:  Normal osseous mineralization. Joint spaces preserved. No fracture/dislocation/boney destruction.    Assessment: 1.  Second MPJ capsulitis left   Plan of Care:  1. Patient evaluated. X-Rays reviewed.  2.  Injection of 0.5 cc Celestone Soluspan injected in the second MTPJ left foot 3.  Prescription for meloxicam 15 mg daily 4.  Recommend good supportive shoe gear for the next 4 weeks.  Avoid any flats or going barefoot. 5.  Return to clinic as needed      Karina Ingram, DPM [facility]  Dr. Felecia Ingram, DPM    [address] [place], [place] [address]                Office [phone]  Fax [phone]

## 2018-09-13 ENCOUNTER — Telehealth: Payer: Self-pay | Admitting: Podiatry

## 2018-09-13 NOTE — Telephone Encounter (Signed)
Pt called to leave a message for Dr. Logan Bores: Thank you I feel so much better already.

## 2018-09-17 ENCOUNTER — Other Ambulatory Visit: Payer: Self-pay | Admitting: Family Medicine

## 2018-09-17 DIAGNOSIS — F329 Major depressive disorder, single episode, unspecified: Secondary | ICD-10-CM

## 2018-09-27 ENCOUNTER — Encounter: Payer: Self-pay | Admitting: Family Medicine

## 2018-10-07 ENCOUNTER — Encounter: Payer: Self-pay | Admitting: Family Medicine

## 2018-10-24 DIAGNOSIS — F325 Major depressive disorder, single episode, in full remission: Secondary | ICD-10-CM | POA: Insufficient documentation

## 2018-10-24 DIAGNOSIS — F4323 Adjustment disorder with mixed anxiety and depressed mood: Secondary | ICD-10-CM

## 2018-10-24 DIAGNOSIS — K760 Fatty (change of) liver, not elsewhere classified: Secondary | ICD-10-CM | POA: Insufficient documentation

## 2018-10-24 DIAGNOSIS — R945 Abnormal results of liver function studies: Secondary | ICD-10-CM

## 2018-10-24 HISTORY — DX: Adjustment disorder with mixed anxiety and depressed mood: F43.23

## 2018-10-24 NOTE — Progress Notes (Signed)
Karina Ingram is a 67 y.o. female is here for follow up.  History of Present Illness:   [name], CMA acting as scribe for Dr. Helane Rima.   HPI:   Pain of toe of left foot  Evaluated at last office visit. No longer fracture on xray but obvious deformity. Was send to Podiatry. She was seen by [facility] on 09/06/18. She has had improvement with foot pain. Only little pain at joint of foot if she does not keep covered as directed by podiatry.   Reactive depression  At last visit patient was started on Lexapro 10 as well as given information for counseling services.  She states that she has noticed some improvement. She is still having a lot of fatigue as well as not wanting to be involved with activities. She has appointment with Misty Stanley in January.    Health Maintenance Due  Topic Date Due  . PNA vac Low Risk Adult (1 of 2 - PCV13) 04/04/2018   Depression screen [facility] 2/9 10/25/2018 08/25/2018 02/26/2018  Decreased Interest 3 1 0  Down, Depressed, Hopeless 3 2 0  PHQ - 2 Score 6 3 0  Altered sleeping 0 1 0  Tired, decreased energy 3 2 3   Change in appetite 2 2 1   Feeling bad or failure about yourself  0 0 0  Trouble concentrating 3 3 1   Moving slowly or fidgety/restless 0 0 0  Suicidal thoughts 0 0 0  PHQ-9 Score 14 11 5   Difficult doing work/chores Very difficult Somewhat difficult Not difficult at all   PMHx, SurgHx, SocialHx, FamHx, Medications, and Allergies were reviewed in the Visit Navigator and updated as appropriate.   Patient Active Problem List   Diagnosis Date Noted  . Reactive depression 10/25/2018  . Elevated LFTs 10/24/2018  . Depression, major, in remission (HCC) 10/24/2018  . Family history of premature CAD 02/19/2016  . Essential hypertension, on Lisinopril/HCTZ and Norvasc 12/27/2014  . Vitamin D deficiency 12/27/2014  . Rhinitis, allergic 12/27/2014  . Obesity 12/27/2014  . Varicose vein of leg 12/27/2014  . Ptosis of eyelid 12/27/2014    . Bilateral knee pain 12/27/2014  . Osteopenia 09/02/2011  . Hyperlipidemia with target LDL less than 100, on Lipitor 09/02/2011   Social History   Tobacco Use  . Smoking status: Never Smoker  . Smokeless tobacco: Never Used  Substance Use Topics  . Alcohol use: Yes    Comment: 2-3 per month  . Drug use: No   Current Medications and Allergies:   .  amLODipine (NORVASC) 5 MG tablet, Take 1 tablet (5 mg total) by mouth daily., Disp: 90 tablet, Rfl: 3 .  aspirin 81 MG tablet, Take 1 tablet (81 mg total) by mouth daily., Disp: 90 tablet, Rfl: 3 .  atorvastatin (LIPITOR) 10 MG tablet, Take 1 tablet (10 mg total) by mouth daily., Disp: 90 tablet, Rfl: 3 .  calcium-vitamin D (OSCAL WITH D) 500-200 MG-UNIT per tablet, Take 2 tablets by mouth daily. , Disp: , Rfl:  .  Cholecalciferol (VITAMIN D3) 1000 units CAPS, Take 1 capsule by mouth daily., Disp: , Rfl:  .  escitalopram (LEXAPRO) 10 MG tablet, TAKE 1 TABLET BY MOUTH EVERY DAY, Disp: 90 tablet, Rfl: 1 .  fexofenadine (ALLEGRA) 30 MG tablet, Take 30 mg by mouth 2 (two) times daily. Reported on 02/19/2016, Disp: , Rfl:  .  fluticasone (FLONASE) 50 MCG/ACT nasal spray, Place 2 sprays into both nostrils daily., Disp: 16 g, Rfl:  11 .  lisinopril-hydrochlorothiazide (PRINZIDE,ZESTORETIC) 20-12.5 MG tablet, Take 1 tablet by mouth daily., Disp: 90 tablet, Rfl: 3 .  Multiple Vitamins-Minerals (MULTIVITAMIN WITH MINERALS) tablet, Take 1 tablet by mouth daily.  , Disp: , Rfl:  .  Omega-3 Fatty Acids (FISH OIL) 1000 MG CPDR, Take 1 capsule by mouth daily., Disp: , Rfl:    Allergies  Allergen Reactions  . Fosamax [Alendronate Sodium]    Review of Systems   Pertinent items are noted in the HPI. Otherwise, ROS is negative.  Vitals:   Vitals:   10/25/18 0952  BP: 140/82  Pulse: 80  Temp: 98.2 F (36.8 C)  TempSrc: Oral  SpO2: 97%  Weight: 195 lb 9.6 oz (88.7 kg)  Height: 5\' 6"  (1.676 m)     Body mass index is 31.57 kg/m.  Physical  Exam:   Physical Exam  Constitutional: She appears well-nourished.  HENT:  Head: Normocephalic and atraumatic.  Eyes: Pupils are equal, round, and reactive to light. EOM are normal.  Neck: Normal range of motion. Neck supple.  Cardiovascular: Normal rate, regular rhythm, normal heart sounds and intact distal pulses.  Pulmonary/Chest: Effort normal.  Abdominal: Soft.  Skin: Skin is warm.  Psychiatric: She has a normal mood and affect. Her behavior is normal.  Nursing note and vitals reviewed.  Assessment and Plan:   Diagnoses and all orders for this visit:  Reactive depression Comments: Improved with Lexapro. Sleep also improved. Still no motivation. Will trial Wellbutrin. Benefits v side effects reviewed. Orders: -     escitalopram (LEXAPRO) 10 MG tablet; Take 1 tablet (10 mg total) by mouth daily. -     buPROPion (WELLBUTRIN XL) 150 MG 24 hr tablet; Take 1 tablet (150 mg total) by mouth daily. Start 150 mg ER PO qam, increase after 3 days to 150 mg ER PO BID.  Essential hypertension Comments: Home BPs in range. She will keep a log. This should decrease ASCVD. Offered Cardiac CT. She wants to wait. Orders: -     lisinopril-hydrochlorothiazide (PRINZIDE,ZESTORETIC) 20-12.5 MG tablet; Take 1 tablet by mouth daily. -     amLODipine (NORVASC) 5 MG tablet; Take 1 tablet (5 mg total) by mouth daily. -     aspirin 81 MG tablet; Take 1 tablet (81 mg total) by mouth daily.  Hyperlipidemia with target LDL less than 100 Comments: Continue Lipitor. No issues. Orders: -     atorvastatin (LIPITOR) 10 MG tablet; Take 1 tablet (10 mg total) by mouth daily.  Seasonal allergic rhinitis due to pollen -     fluticasone (FLONASE) 50 MCG/ACT nasal spray; Place 2 sprays into both nostrils daily.  Vitamin D deficiency -     Cholecalciferol (VITAMIN D3) 25 MCG (1000 UT) CAPS; Take 1 capsule (1,000 Units total) by mouth daily.  Osteopenia, unspecified location  Need for pneumococcal  vaccination Comments: After discussion, will give PCV23. Orders: -     Pneumococcal polysaccharide vaccine 23-valent greater than or equal to 2yo subcutaneous/IM  Per verbal orders from Dr. Earlene Plater patient given Pneumococal 23. She will get Prevnar 13 in one year.   . Reviewed expectations re: course of current medical issues. . Discussed self-management of symptoms. . Outlined signs and symptoms indicating need for more acute intervention. . Patient verbalized understanding and all questions were answered. Marland Kitchen Health Maintenance issues including appropriate healthy diet, exercise, and smoking avoidance were discussed with patient. . See orders for this visit as documented in the electronic medical record. . Patient received an  After Visit Summary.  Helane RimaErica Lorely Bubb, DO [place], [facility] 10/25/2018  Future Appointments  Date Time Provider Department Center  12/10/2018 11:00 AM Shelor Wynona MealsFlores, [place], [place] [facility] None  12/27/2018 11:40 AM Helane RimaWallace, Colston Pyle, DO [facility]  02/28/2019  8:40 AM Helane RimaWallace, Dohn Stclair, DO [facility]   CMA served as scribe during this visit. History, Physical, and Plan performed by medical provider. The above documentation has been reviewed and is accurate and complete. Helane RimaErica Dinh Ayotte, D.O.

## 2018-10-25 ENCOUNTER — Ambulatory Visit: Payer: Medicare Other | Admitting: Family Medicine

## 2018-10-25 ENCOUNTER — Encounter: Payer: Self-pay | Admitting: Family Medicine

## 2018-10-25 VITALS — BP 140/82 | HR 80 | Temp 98.2°F | Ht 66.0 in | Wt 195.6 lb

## 2018-10-25 DIAGNOSIS — F329 Major depressive disorder, single episode, unspecified: Secondary | ICD-10-CM

## 2018-10-25 DIAGNOSIS — Z23 Encounter for immunization: Secondary | ICD-10-CM | POA: Diagnosis not present

## 2018-10-25 DIAGNOSIS — I10 Essential (primary) hypertension: Secondary | ICD-10-CM | POA: Diagnosis not present

## 2018-10-25 DIAGNOSIS — M8580 Other specified disorders of bone density and structure, unspecified site: Secondary | ICD-10-CM

## 2018-10-25 DIAGNOSIS — E559 Vitamin D deficiency, unspecified: Secondary | ICD-10-CM

## 2018-10-25 DIAGNOSIS — E785 Hyperlipidemia, unspecified: Secondary | ICD-10-CM | POA: Diagnosis not present

## 2018-10-25 DIAGNOSIS — J301 Allergic rhinitis due to pollen: Secondary | ICD-10-CM

## 2018-10-25 MED ORDER — ESCITALOPRAM OXALATE 10 MG PO TABS: 10.0000 mg | ORAL_TABLET | Freq: Every day | ORAL | 1 refills | Status: DC

## 2018-10-25 MED ORDER — ASPIRIN 81 MG PO TABS: 81.0000 mg | ORAL_TABLET | Freq: Every day | ORAL | 3 refills | Status: DC

## 2018-10-25 MED ORDER — ATORVASTATIN CALCIUM 10 MG PO TABS: 10.0000 mg | ORAL_TABLET | Freq: Every day | ORAL | 3 refills | Status: DC

## 2018-10-25 MED ORDER — AMLODIPINE BESYLATE 5 MG PO TABS: 5.0000 mg | ORAL_TABLET | Freq: Every day | ORAL | 3 refills | Status: DC

## 2018-10-25 MED ORDER — BUPROPION HCL ER (XL) 150 MG PO TB24: 150.0000 mg | ORAL_TABLET | Freq: Every day | ORAL | 3 refills | Status: DC

## 2018-10-25 MED ORDER — VITAMIN D3 25 MCG (1000 UT) PO CAPS: 1.0000 | ORAL_CAPSULE | Freq: Every day | ORAL | 3 refills | Status: AC

## 2018-10-25 MED ORDER — LISINOPRIL-HYDROCHLOROTHIAZIDE 20-12.5 MG PO TABS: 1.0000 | ORAL_TABLET | Freq: Every day | ORAL | 3 refills | Status: DC

## 2018-10-25 MED ORDER — FLUTICASONE PROPIONATE 50 MCG/ACT NA SUSP: 2.0000 | Freq: Every day | NASAL | 11 refills | Status: DC

## 2018-10-25 NOTE — Patient Instructions (Addendum)
IT WAS NICE TO SEE YOU TODAY!  Please check you blood pressures and home and bring in a log for review. We have given you a log to bring with you at next visit.

## 2018-12-10 ENCOUNTER — Ambulatory Visit: Payer: Medicare Other | Admitting: Psychology

## 2018-12-10 DIAGNOSIS — F4321 Adjustment disorder with depressed mood: Secondary | ICD-10-CM

## 2018-12-23 ENCOUNTER — Other Ambulatory Visit: Payer: Self-pay | Admitting: Family Medicine

## 2018-12-23 DIAGNOSIS — F329 Major depressive disorder, single episode, unspecified: Secondary | ICD-10-CM

## 2018-12-24 ENCOUNTER — Ambulatory Visit (INDEPENDENT_AMBULATORY_CARE_PROVIDER_SITE_OTHER): Payer: Medicare Other | Admitting: Psychology

## 2018-12-24 DIAGNOSIS — F4321 Adjustment disorder with depressed mood: Secondary | ICD-10-CM

## 2018-12-26 NOTE — Progress Notes (Signed)
Karina Ingram is a 68 y.o. female is here for follow up.  History of Present Illness:   Karina Ingram, CMA acting as scribe for Dr. Helane Ingram.   HPI:  Current symptoms include fatigue. Symptoms have been gradually improving. Patient denies current suicidal and homicidal ideation. Previous treatment includes: medication and followed by Karina Ingram . Side effects from the treatment: none. Alcohol use: She drinks a glass of wine about once a month.  Drug use: none.  Exercise: at the Karina Ingram 3 days a week.  She has noticed continued improvement in symptom - decrease in fatigue.     There are no preventive care reminders to display for this patient.   Depression screen [facility] 2/9 12/27/2018 10/25/2018 08/25/2018  Decreased Interest 0 3 1  Down, Depressed, Hopeless 0 3 2  PHQ - 2 Score 0 6 3  Altered sleeping 1 0 1  Tired, decreased energy 0 3 2  Change in appetite 0 2 2  Feeling bad or failure about yourself  0 0 0  Trouble concentrating 0 3 3  Moving slowly or fidgety/restless 0 0 0  Suicidal thoughts 0 0 0  PHQ-9 Score 1 14 11   Difficult doing work/chores Not difficult at all Very difficult Somewhat difficult   PMHx, SurgHx, SocialHx, FamHx, Medications, and Allergies were reviewed in the Visit Navigator and updated as appropriate.   Patient Active Problem List   Diagnosis Date Noted  . Reactive depression 10/25/2018  . Elevated LFTs 10/24/2018  . Depression, major, in remission (HCC) 10/24/2018  . Family history of premature CAD 02/19/2016  . Essential hypertension, on Lisinopril/HCTZ and Norvasc 12/27/2014  . Vitamin D deficiency 12/27/2014  . Rhinitis, allergic 12/27/2014  . Obesity 12/27/2014  . Varicose vein of leg 12/27/2014  . Ptosis of eyelid 12/27/2014  . Bilateral knee pain 12/27/2014  . Obesity (BMI 30-39.9) 09/02/2011  . Osteopenia 09/02/2011  . Hyperlipidemia with target LDL less than 100, on Lipitor 09/02/2011   Social History   Tobacco Use  . Smoking status:  Never Smoker  . Smokeless tobacco: Never Used  Substance Use Topics  . Alcohol use: Yes    Comment: 2-3 per month  . Drug use: No   Current Medications and Allergies:   Current Outpatient Medications:  .  amLODipine (NORVASC) 5 MG tablet, Take 1 tablet (5 mg total) by mouth daily., Disp: 90 tablet, Rfl: 3 .  aspirin 81 MG tablet, Take 1 tablet (81 mg total) by mouth daily., Disp: 90 tablet, Rfl: 3 .  atorvastatin (LIPITOR) 10 MG tablet, Take 1 tablet (10 mg total) by mouth daily., Disp: 90 tablet, Rfl: 3 .  buPROPion (WELLBUTRIN XL) 150 MG 24 hr tablet, Take 1 tablet (150 mg total) by mouth 2 (two) times daily., Disp: 180 tablet, Rfl: 3 .  calcium-vitamin D (OSCAL WITH D) 500-200 MG-UNIT per tablet, Take 2 tablets by mouth daily. , Disp: , Rfl:  .  Cholecalciferol (VITAMIN D3) 25 MCG (1000 UT) CAPS, Take 1 capsule (1,000 Units total) by mouth daily., Disp: 90 capsule, Rfl: 3 .  escitalopram (LEXAPRO) 10 MG tablet, Take 1 tablet (10 mg total) by mouth daily., Disp: 90 tablet, Rfl: 1 .  fexofenadine (ALLEGRA) 30 MG tablet, Take 30 mg by mouth 2 (two) times daily. Reported on 02/19/2016, Disp: , Rfl:  .  fluticasone (FLONASE) 50 MCG/ACT nasal spray, Place 2 sprays into both nostrils daily., Disp: 16 g, Rfl: 11 .  lisinopril-hydrochlorothiazide (PRINZIDE,ZESTORETIC) 20-12.5 MG tablet, Take 1  tablet by mouth daily., Disp: 90 tablet, Rfl: 3 .  Multiple Vitamins-Minerals (MULTIVITAMIN WITH MINERALS) tablet, Take 1 tablet by mouth daily.  , Disp: , Rfl:  .  Omega-3 Fatty Acids (FISH OIL) 1000 MG CPDR, Take 1 capsule by mouth daily., Disp: , Rfl:    Allergies  Allergen Reactions  . Fosamax [Alendronate Sodium]    Review of Systems   Pertinent items are noted in the HPI. Otherwise, a complete ROS is negative.  Vitals:   Vitals:   12/27/18 1133  BP: 136/80  Pulse: 88  Temp: 98.6 F (37 C)  TempSrc: Oral  SpO2: 97%  Weight: 196 lb (88.9 kg)  Height: 5\' 6"  (1.676 m)     Body mass index  is 31.64 kg/m.  Physical Exam:   Physical Exam Vitals signs and nursing note reviewed.  Constitutional:      General: She is not in acute distress.    Appearance: She is well-developed.  HENT:     Head: Normocephalic and atraumatic.     Right Ear: External ear normal.     Left Ear: External ear normal.     Nose: Nose normal.  Eyes:     Conjunctiva/sclera: Conjunctivae normal.     Pupils: Pupils are equal, round, and reactive to light.  Neck:     Musculoskeletal: Normal range of motion and neck supple.     Thyroid: No thyromegaly.  Cardiovascular:     Rate and Rhythm: Normal rate and regular rhythm.     Heart sounds: Normal heart sounds.  Pulmonary:     Effort: Pulmonary effort is normal.     Breath sounds: Normal breath sounds.  Abdominal:     General: Bowel sounds are normal.     Palpations: Abdomen is soft.  Musculoskeletal: Normal range of motion.  Lymphadenopathy:     Cervical: No cervical adenopathy.  Skin:    General: Skin is warm and dry.     Capillary Refill: Capillary refill takes less than 2 seconds.  Neurological:     Mental Status: She is alert and oriented to person, place, and time.  Psychiatric:        Behavior: Behavior normal.    Assessment and Plan:   Karina Ingram was seen today for follow-up.  Diagnoses and all orders for this visit:  Reactive depression Comments: Improved with Lexapro. Sleep also improved. Motivation improved with Wellbutrin. Husband improving. Seeing Karina Ingram.  Orders: -     buPROPion (WELLBUTRIN XL) 150 MG 24 hr tablet; Take 1 tablet (150 mg total) by mouth 2 (two) times daily.  Essential hypertension Comments: At goal.  Hyperlipidemia with target LDL less than 100 Comments: Doing well. Will be due for labs at the next visit.   Obesity (BMI 30-39.9)   . Orders and follow up as documented in EpicCare, reviewed diet, exercise and weight control, cardiovascular risk and specific lipid/LDL goals reviewed, reviewed medications and  side effects in detail.  . Reviewed expectations re: course of current medical issues. . Outlined signs and symptoms indicating need for more acute intervention. . Patient verbalized understanding and all questions were answered. . Patient received an After Visit Summary.  CMA served as Neurosurgeonscribe during this visit. History, Physical, and Plan performed by medical provider. The above documentation has been reviewed and is accurate and complete. Karina RimaErica Daymian Lill, D.O.  Karina RimaErica Laith Antonelli, DO [place], [facility] 01/05/2019

## 2018-12-27 ENCOUNTER — Ambulatory Visit: Payer: Medicare Other | Admitting: Family Medicine

## 2018-12-27 ENCOUNTER — Encounter: Payer: Self-pay | Admitting: Family Medicine

## 2018-12-27 VITALS — BP 136/80 | HR 88 | Temp 98.6°F | Ht 66.0 in | Wt 196.0 lb

## 2018-12-27 DIAGNOSIS — E669 Obesity, unspecified: Secondary | ICD-10-CM

## 2018-12-27 DIAGNOSIS — E785 Hyperlipidemia, unspecified: Secondary | ICD-10-CM

## 2018-12-27 DIAGNOSIS — I10 Essential (primary) hypertension: Secondary | ICD-10-CM | POA: Diagnosis not present

## 2018-12-27 DIAGNOSIS — F329 Major depressive disorder, single episode, unspecified: Secondary | ICD-10-CM | POA: Diagnosis not present

## 2018-12-27 MED ORDER — BUPROPION HCL ER (XL) 150 MG PO TB24: 150.0000 mg | ORAL_TABLET | Freq: Two times a day (BID) | ORAL | 3 refills | Status: DC

## 2019-01-05 ENCOUNTER — Other Ambulatory Visit: Payer: Self-pay | Admitting: Podiatry

## 2019-01-07 ENCOUNTER — Ambulatory Visit (INDEPENDENT_AMBULATORY_CARE_PROVIDER_SITE_OTHER): Payer: Medicare Other | Admitting: Psychology

## 2019-01-07 DIAGNOSIS — F4321 Adjustment disorder with depressed mood: Secondary | ICD-10-CM | POA: Diagnosis not present

## 2019-02-25 ENCOUNTER — Ambulatory Visit (INDEPENDENT_AMBULATORY_CARE_PROVIDER_SITE_OTHER): Payer: Medicare Other | Admitting: Psychology

## 2019-02-25 ENCOUNTER — Other Ambulatory Visit: Payer: Self-pay

## 2019-02-25 DIAGNOSIS — F4321 Adjustment disorder with depressed mood: Secondary | ICD-10-CM | POA: Diagnosis not present

## 2019-02-28 ENCOUNTER — Encounter: Payer: BC Managed Care – PPO | Admitting: Family Medicine

## 2019-03-29 ENCOUNTER — Ambulatory Visit: Payer: Medicare Other | Admitting: Psychology

## 2019-03-30 ENCOUNTER — Encounter: Payer: Medicare Other | Admitting: Family Medicine

## 2019-06-01 ENCOUNTER — Encounter: Payer: Self-pay | Admitting: Family Medicine

## 2019-06-03 ENCOUNTER — Other Ambulatory Visit: Payer: Self-pay

## 2019-06-03 ENCOUNTER — Ambulatory Visit (INDEPENDENT_AMBULATORY_CARE_PROVIDER_SITE_OTHER): Payer: Medicare Other | Admitting: Family Medicine

## 2019-06-03 ENCOUNTER — Encounter: Payer: Self-pay | Admitting: Family Medicine

## 2019-06-03 ENCOUNTER — Ambulatory Visit: Payer: Medicare Other | Admitting: Family Medicine

## 2019-06-03 VITALS — Ht 66.0 in

## 2019-06-03 DIAGNOSIS — I10 Essential (primary) hypertension: Secondary | ICD-10-CM | POA: Diagnosis not present

## 2019-06-03 DIAGNOSIS — Z7189 Other specified counseling: Secondary | ICD-10-CM

## 2019-06-03 DIAGNOSIS — E785 Hyperlipidemia, unspecified: Secondary | ICD-10-CM

## 2019-06-03 DIAGNOSIS — Z20828 Contact with and (suspected) exposure to other viral communicable diseases: Secondary | ICD-10-CM

## 2019-06-03 DIAGNOSIS — Z20822 Contact with and (suspected) exposure to covid-19: Secondary | ICD-10-CM

## 2019-06-03 NOTE — Patient Instructions (Addendum)

## 2019-06-04 ENCOUNTER — Encounter: Payer: Self-pay | Admitting: Family Medicine

## 2019-06-05 NOTE — Progress Notes (Signed)
Virtual Visit via Video   Due to the COVID-19 pandemic, this visit was completed with telemedicine (audio/video) technology to reduce patient and provider exposure as well as to preserve personal protective equipment.   I connected with Karina Ingram by a video enabled telemedicine application and verified that I am speaking with the correct person using two identifiers. Location patient: Home Location provider: [facility], Office Persons participating in the virtual visit: Karina Ingram, Mikeal Winstanley, DO   I discussed the limitations of evaluation and management by telemedicine and the availability of in person appointments. The patient expressed understanding and agreed to proceed.  Care Team   Patient Care Team: Helane RimaWallace, Kuuipo Anzaldo, DO as PCP - General (Family Medicine)  Subjective:   HPI: Patient went to a funeral last week.  She did wear a mask and social distance is much as possible, but helped the family to arrange for the caterer to be set up.  There was a buffet.  She later found out that one of the guests had exposure to someone with a positive COVID test.  She and her husband are asymptomatic at this time.  The guest is still waiting on COVID test results.  Patient has questions regarding isolation and need for testing.  Review of Systems  Constitutional: Negative for chills, fever, malaise/fatigue and weight loss.  Respiratory: Negative for cough, shortness of breath and wheezing.   Cardiovascular: Negative for chest pain, palpitations and leg swelling.  Gastrointestinal: Negative for abdominal pain, constipation, diarrhea, nausea and vomiting.  Genitourinary: Negative for dysuria and urgency.  Musculoskeletal: Negative for joint pain and myalgias.  Skin: Negative for rash.  Neurological: Negative for dizziness and headaches.  Psychiatric/Behavioral: Negative for depression, substance abuse and suicidal ideas. The patient is not nervous/anxious.     Patient Active  Problem List   Diagnosis Date Noted  . Reactive depression 10/25/2018  . Elevated LFTs 10/24/2018  . Depression, major, in remission (HCC) 10/24/2018  . Family history of premature CAD 02/19/2016  . Essential hypertension, on Lisinopril/HCTZ and Norvasc 12/27/2014  . Vitamin D deficiency 12/27/2014  . Rhinitis, allergic 12/27/2014  . Obesity 12/27/2014  . Varicose vein of leg 12/27/2014  . Ptosis of eyelid 12/27/2014  . Bilateral knee pain 12/27/2014  . Obesity (BMI 30-39.9) 09/02/2011  . Osteopenia 09/02/2011  . Hyperlipidemia with target LDL less than 100, on Lipitor 09/02/2011    Social History   Tobacco Use  . Smoking status: Never Smoker  . Smokeless tobacco: Never Used  Substance Use Topics  . Alcohol use: Yes    Comment: 2-3 per month    Current Outpatient Medications:  .  amLODipine (NORVASC) 5 MG tablet, Take 1 tablet (5 mg total) by mouth daily., Disp: 90 tablet, Rfl: 3 .  aspirin 81 MG tablet, Take 1 tablet (81 mg total) by mouth daily., Disp: 90 tablet, Rfl: 3 .  atorvastatin (LIPITOR) 10 MG tablet, Take 1 tablet (10 mg total) by mouth daily., Disp: 90 tablet, Rfl: 3 .  buPROPion (WELLBUTRIN XL) 150 MG 24 hr tablet, Take 1 tablet (150 mg total) by mouth 2 (two) times daily., Disp: 180 tablet, Rfl: 3 .  calcium-vitamin D (OSCAL WITH D) 500-200 MG-UNIT per tablet, Take 2 tablets by mouth daily. , Disp: , Rfl:  .  Cholecalciferol (VITAMIN D3) 25 MCG (1000 UT) CAPS, Take 1 capsule (1,000 Units total) by mouth daily., Disp: 90 capsule, Rfl: 3 .  escitalopram (LEXAPRO) 10 MG tablet, Take 1 tablet (10  mg total) by mouth daily., Disp: 90 tablet, Rfl: 1 .  fexofenadine (ALLEGRA) 30 MG tablet, Take 30 mg by mouth daily. Reported on 02/19/2016, Disp: , Rfl:  .  fluticasone (FLONASE) 50 MCG/ACT nasal spray, Place 2 sprays into both nostrils daily., Disp: 16 g, Rfl: 11 .  lisinopril-hydrochlorothiazide (PRINZIDE,ZESTORETIC) 20-12.5 MG tablet, Take 1 tablet by mouth daily., Disp: 90  tablet, Rfl: 3 .  meloxicam (MOBIC) 15 MG tablet, TAKE 1 TABLET BY MOUTH EVERY DAY, Disp: 60 tablet, Rfl: 1 .  Multiple Vitamins-Minerals (MULTIVITAMIN WITH MINERALS) tablet, Take 1 tablet by mouth daily.  , Disp: , Rfl:  .  Omega-3 Fatty Acids (FISH OIL) 1000 MG CPDR, Take 1 capsule by mouth daily., Disp: , Rfl:   Allergies  Allergen Reactions  . Fosamax [Alendronate Sodium]     Objective:   VITALS: Per patient if applicable, see vitals. GENERAL: Alert, appears well and in no acute distress. HEENT: Atraumatic, conjunctiva clear, no obvious abnormalities on inspection of external nose and ears. NECK: Normal movements of the head and neck. CARDIOPULMONARY: No increased WOB. Speaking in clear sentences. I:E ratio WNL.  MS: Moves all visible extremities without noticeable abnormality. PSYCH: Pleasant and cooperative, well-groomed. Speech normal rate and rhythm. Affect is appropriate. Insight and judgement are appropriate. Attention is focused, linear, and appropriate.  NEURO: CN grossly intact. Oriented as arrived to appointment on time with no prompting. Moves both UE equally.  SKIN: No obvious lesions, wounds, erythema, or cyanosis noted on face or hands.  Depression screen [facility] 2/9 06/03/2019 12/27/2018 10/25/2018  Decreased Interest 0 0 3  Down, Depressed, Hopeless 0 0 3  PHQ - 2 Score 0 0 6  Altered sleeping 0 1 0  Tired, decreased energy 0 0 3  Change in appetite 0 0 2  Feeling bad or failure about yourself  0 0 0  Trouble concentrating 0 0 3  Moving slowly or fidgety/restless 0 0 0  Suicidal thoughts 0 0 0  PHQ-9 Score 0 1 14  Difficult doing work/chores Not difficult at all Not difficult at all Very difficult    Assessment and Plan:   [name] was seen today for covid 19 exposure.  Diagnoses and all orders for this visit:  Exposure to Covid-19 Virus  Educated About Covid-19 Virus Infection  Essential hypertension, on Lisinopril/HCTZ and Norvasc  Hyperlipidemia with  target LDL less than 100, on Lipitor   . COVID-19 Education: The signs and symptoms of COVID-19 were discussed with the patient and how to seek care for testing if needed. The importance of social distancing was discussed today. . Reviewed expectations re: course of current medical issues. . Discussed self-management of symptoms. . Outlined signs and symptoms indicating need for more acute intervention. . Patient verbalized understanding and all questions were answered. Marland Kitchen Health Maintenance issues including appropriate healthy diet, exercise, and smoking avoidance were discussed with patient. . See orders for this visit as documented in the electronic medical record.  Briscoe Deutscher, DO  Records requested if needed. Time spent: 15 minutes, of which >50% was spent in obtaining information about her symptoms, reviewing her previous labs, evaluations, and treatments, counseling her about her condition (please see the discussed topics above), and developing a plan to further investigate it; she had a number of questions which I addressed.

## 2019-06-07 ENCOUNTER — Other Ambulatory Visit: Payer: Self-pay | Admitting: Family Medicine

## 2019-06-07 DIAGNOSIS — Z1231 Encounter for screening mammogram for malignant neoplasm of breast: Secondary | ICD-10-CM

## 2019-06-16 NOTE — Progress Notes (Signed)
Subjective:    Karina Ingram is a 68 y.o. female who presents for Medicare Annual (Subsequent) preventive examination.  Review of Systems  Constitutional: Negative for chills, fever, malaise/fatigue and weight loss.  Respiratory: Negative for cough, shortness of breath and wheezing.   Cardiovascular: Negative for chest pain, palpitations and leg swelling.  Gastrointestinal: Negative for abdominal pain, constipation, diarrhea, nausea and vomiting.  Genitourinary: Negative for dysuria and urgency.  Musculoskeletal: Negative for joint pain and myalgias.  Skin: Negative for rash.  Neurological: Negative for dizziness and headaches.  Psychiatric/Behavioral: Negative for depression, substance abuse and suicidal ideas. The patient is not nervous/anxious.    Objective:   Vitals: BP 120/82 (BP Location: Left Arm, Patient Position: Sitting, Cuff Size: Large)   Pulse 82   Temp 98.4 F (36.9 C) (Oral)   Ht 5\' 6"  (1.676 m)   Wt 196 lb 8 oz (89.1 kg)   SpO2 96%   BMI 31.72 kg/m   Body mass index is 31.72 kg/m.  Physical Exam Vitals signs and nursing note reviewed.  HENT:     Head: Normocephalic and atraumatic.  Eyes:     Pupils: Pupils are equal, round, and reactive to light.  Neck:     Musculoskeletal: Normal range of motion and neck supple.  Cardiovascular:     Rate and Rhythm: Normal rate and regular rhythm.     Heart sounds: Normal heart sounds.  Pulmonary:     Effort: Pulmonary effort is normal.  Abdominal:     Palpations: Abdomen is soft.  Skin:    General: Skin is warm.  Psychiatric:        Behavior: Behavior normal.    Social History   Tobacco Use  Smoking Status Never Smoker  Smokeless Tobacco Never Used     Patient Active Problem List   Diagnosis Date Noted  . Reactive depression 10/25/2018  . Elevated LFTs 10/24/2018  . Depression, major, in remission (HCC) 10/24/2018  . Family history of premature CAD 02/19/2016  . Essential hypertension, on  Lisinopril/HCTZ and Norvasc 12/27/2014  . Vitamin D deficiency 12/27/2014  . Rhinitis, allergic 12/27/2014  . Obesity 12/27/2014  . Varicose vein of leg 12/27/2014  . Ptosis of eyelid 12/27/2014  . Bilateral knee pain 12/27/2014  . Obesity (BMI 30-39.9) 09/02/2011  . Osteopenia 09/02/2011  . Hyperlipidemia with target LDL less than 100, on Lipitor 09/02/2011   Past Surgical History:  Procedure Laterality Date  . COLONOSCOPY  2019  . INGUINAL HERNIA REPAIR  1980  . TONSILLECTOMY    . WISDOM TOOTH EXTRACTION     Family History  Problem Relation Age of Onset  . Cancer Mother   . Heart disease Mother   . Hypertension Mother   . Stroke Father   . Hypertension Father   . Heart disease Father   . COPD Father   . Other Sister   . Hypertension Brother   . Hyperlipidemia Brother   . Renal cancer Brother    Social History   Tobacco Use  . Smoking status: Never Smoker  . Smokeless tobacco: Never Used  Substance Use Topics  . Alcohol use: Yes    Comment: 2-3 per month  . Drug use: No   Current Outpatient Medications:  .  amLODipine (NORVASC) 5 MG tablet, Take 1 tablet (5 mg total) by mouth daily., Disp: 90 tablet, Rfl: 3 .  aspirin 81 MG tablet, Take 1 tablet (81 mg total) by mouth daily., Disp: 90 tablet, Rfl: 3 .  atorvastatin (LIPITOR) 10 MG tablet, Take 1 tablet (10 mg total) by mouth daily., Disp: 90 tablet, Rfl: 3 .  buPROPion (WELLBUTRIN XL) 150 MG 24 hr tablet, Take 1 tablet (150 mg total) by mouth 2 (two) times daily., Disp: 180 tablet, Rfl: 3 .  calcium-vitamin D (OSCAL WITH D) 500-200 MG-UNIT per tablet, Take 2 tablets by mouth daily. , Disp: , Rfl:  .  Cholecalciferol (VITAMIN D3) 25 MCG (1000 UT) CAPS, Take 1 capsule (1,000 Units total) by mouth daily., Disp: 90 capsule, Rfl: 3 .  escitalopram (LEXAPRO) 10 MG tablet, Take 1 tablet (10 mg total) by mouth daily., Disp: 90 tablet, Rfl: 1 .  fexofenadine (ALLEGRA) 30 MG tablet, Take 30 mg by mouth daily. Reported on  02/19/2016, Disp: , Rfl:  .  fluticasone (FLONASE) 50 MCG/ACT nasal spray, Place 2 sprays into both nostrils daily., Disp: 16 g, Rfl: 11 .  lisinopril-hydrochlorothiazide (PRINZIDE,ZESTORETIC) 20-12.5 MG tablet, Take 1 tablet by mouth daily., Disp: 90 tablet, Rfl: 3 .  Multiple Vitamins-Minerals (MULTIVITAMIN WITH MINERALS) tablet, Take 1 tablet by mouth daily.  , Disp: , Rfl:  .  Omega-3 Fatty Acids (FISH OIL) 1000 MG CPDR, Take 1 capsule by mouth daily., Disp: , Rfl:   Activities of Daily Living In your present state of health, do you have any difficulty performing the following activities: 06/17/2019  Hearing? N  Vision? N  Difficulty concentrating or making decisions? N  Walking or climbing stairs? N  Dressing or bathing? N  Doing errands, shopping? N  Preparing Food and eating ? N  Using the Toilet? N  In the past six months, have you accidently leaked urine? N  Do you have problems with loss of bowel control? N  Managing your Medications? N  Managing your Finances? N  Housekeeping or managing your Housekeeping? N  Some recent data might be hidden   Patient Care Team: Briscoe Deutscher, DO as PCP - General (Family Medicine)  Advanced Directive status reviewed and updated in chart. Full code. Has POA, living will but updating them.  Assessment:   Karina Ingram was seen today for annual exam.  Diagnoses and all orders for this visit:  Elevated LFTs -     Comprehensive metabolic panel  Essential hypertension, on Lisinopril/HCTZ and Norvasc  Osteopenia, unspecified location  Hyperlipidemia with target LDL less than 100, on Lipitor -     Lipid panel  Medicare annual wellness visit, subsequent  Need for vaccination for zoster -     Varicella-zoster vaccine IM - SHINGRIX  Obesity (BMI 30-39.9)  Malaise and fatigue -     CBC with Differential/Platelet -     TSH  Hyperglycemia -     Hemoglobin A1c  Exercise Activities and Dietary recommendations Increase water intake,  increase physical activity.  Fall Risk Fall Risk  06/17/2019 06/17/2019 12/27/2018 10/25/2018 08/25/2018  Falls in the past year? 0 0 0 0 No  Number falls in past yr: - - 0 - -  Injury with Fall? - - 0 - -  Comment - - - - -  Risk for fall due to : - - - - -  Follow up - - - - -   Is the patient's home free of loose throw rugs in walkways, pet beds, electrical cords, etc?   yes. Grab bars in the bathroom? yes. Handrails on the stairs?   yes. Adequate lighting? yes.  Timed Get Up and Go performed: yes, taking < 12 seconds  Depression Screen PHQ 2/9  Scores 06/17/2019 06/17/2019 06/03/2019 12/27/2018  PHQ - 2 Score 0 0 0 0  PHQ- 9 Score 0 0 0 1    Cognitive Function Mini-Cog     Normal clock drawing test?  yes     How many words correct?  3    Immunization History  Administered Date(s) Administered  . Hepatitis B 10/08/2009, 11/07/2009, 07/17/2010  . Influenza Split 09/02/2011, 09/14/2012, 09/25/2015  . Influenza Whole 08/16/2010, 10/05/2018  . Influenza, High Dose Seasonal PF 10/05/2018  . Influenza-Unspecified 09/07/2016  . Pneumococcal Polysaccharide-23 10/25/2018  . Tdap 08/16/2010  . Zoster 12/12/2013   Qualifies for Shingles Vaccine? Yes Screening Tests Health Maintenance  Topic Date Due  . INFLUENZA VACCINE  07/09/2019  . PNA vac Low Risk Adult (2 of 2 - PCV13) 10/26/2019  . DEXA SCAN  03/01/2020  . MAMMOGRAM  07/20/2020  . TETANUS/TDAP  08/16/2020  . COLONOSCOPY  06/18/2028  . Hepatitis C Screening  Completed   Cancer Screenings: Lung: Low Dose CT Chest recommended if Age 89-80 years, 30 pack-year currently smoking OR have quit w/in 15years. Patient does qualify. Up to date of Bone Density/Dexa? Yes  Plan:   PLEASE NOTE THAT ALL TESTS AND QUESTIONS WERE COMPLETED ON DAY OF VISIT. THIS MAY NOT REFLECT DATE OF SERVICE IF INFORMATION ENTERED ON SUBSEQUENT DAY.  I have personally reviewed and noted the following in the patient's chart:   . Medical and social  history . Use of alcohol, tobacco or illicit drugs  . Current medications and supplements . Functional ability and status . Nutritional status . Physical activity . Advanced directives . List of other physicians . Hospitalizations, surgeries, and ER visits in previous 12 months . Vitals . Screenings to include cognitive, depression, and falls . Referrals and appointments  In addition, I have reviewed and discussed with patient certain preventive protocols, quality metrics, and best practice recommendations. A written personalized care plan for preventive services as well as general preventive health recommendations were provided to patient.   Helane RimaErica Torianna Junio, DO

## 2019-06-17 ENCOUNTER — Other Ambulatory Visit: Payer: Self-pay

## 2019-06-17 ENCOUNTER — Ambulatory Visit (INDEPENDENT_AMBULATORY_CARE_PROVIDER_SITE_OTHER): Payer: Medicare Other | Admitting: Family Medicine

## 2019-06-17 ENCOUNTER — Encounter: Payer: Self-pay | Admitting: Family Medicine

## 2019-06-17 VITALS — BP 120/82 | HR 82 | Temp 98.4°F | Ht 66.0 in | Wt 196.5 lb

## 2019-06-17 DIAGNOSIS — E559 Vitamin D deficiency, unspecified: Secondary | ICD-10-CM

## 2019-06-17 DIAGNOSIS — Z0000 Encounter for general adult medical examination without abnormal findings: Secondary | ICD-10-CM

## 2019-06-17 DIAGNOSIS — R7989 Other specified abnormal findings of blood chemistry: Secondary | ICD-10-CM

## 2019-06-17 DIAGNOSIS — R945 Abnormal results of liver function studies: Secondary | ICD-10-CM | POA: Diagnosis not present

## 2019-06-17 DIAGNOSIS — R5383 Other fatigue: Secondary | ICD-10-CM

## 2019-06-17 DIAGNOSIS — M8580 Other specified disorders of bone density and structure, unspecified site: Secondary | ICD-10-CM | POA: Diagnosis not present

## 2019-06-17 DIAGNOSIS — E669 Obesity, unspecified: Secondary | ICD-10-CM

## 2019-06-17 DIAGNOSIS — E785 Hyperlipidemia, unspecified: Secondary | ICD-10-CM

## 2019-06-17 DIAGNOSIS — R739 Hyperglycemia, unspecified: Secondary | ICD-10-CM

## 2019-06-17 DIAGNOSIS — I10 Essential (primary) hypertension: Secondary | ICD-10-CM

## 2019-06-17 DIAGNOSIS — R5381 Other malaise: Secondary | ICD-10-CM

## 2019-06-17 DIAGNOSIS — Z23 Encounter for immunization: Secondary | ICD-10-CM

## 2019-06-18 ENCOUNTER — Other Ambulatory Visit: Payer: Self-pay | Admitting: Family Medicine

## 2019-06-18 ENCOUNTER — Encounter: Payer: Self-pay | Admitting: Family Medicine

## 2019-06-18 DIAGNOSIS — F329 Major depressive disorder, single episode, unspecified: Secondary | ICD-10-CM

## 2019-06-18 LAB — COMPREHENSIVE METABOLIC PANEL
AG Ratio: 1.8 (calc) (ref 1.0–2.5)
ALT: 33 U/L — ABNORMAL HIGH (ref 6–29)
AST: 26 U/L (ref 10–35)
Albumin: 4.2 g/dL (ref 3.6–5.1)
Alkaline phosphatase (APISO): 107 U/L (ref 37–153)
BUN/Creatinine Ratio: 11 (calc) (ref 6–22)
BUN: 11 mg/dL (ref 7–25)
CO2: 30 mmol/L (ref 20–32)
Calcium: 10.3 mg/dL (ref 8.6–10.4)
Chloride: 98 mmol/L (ref 98–110)
Creat: 1 mg/dL — ABNORMAL HIGH (ref 0.50–0.99)
Globulin: 2.3 g/dL (calc) (ref 1.9–3.7)
Glucose, Bld: 93 mg/dL (ref 65–99)
Potassium: 3.9 mmol/L (ref 3.5–5.3)
Sodium: 138 mmol/L (ref 135–146)
Total Bilirubin: 0.5 mg/dL (ref 0.2–1.2)
Total Protein: 6.5 g/dL (ref 6.1–8.1)

## 2019-06-18 LAB — CBC WITH DIFFERENTIAL/PLATELET
Absolute Monocytes: 618 cells/uL (ref 200–950)
Basophils Absolute: 61 cells/uL (ref 0–200)
Basophils Relative: 0.7 %
Eosinophils Absolute: 148 cells/uL (ref 15–500)
Eosinophils Relative: 1.7 %
HCT: 43.5 % (ref 35.0–45.0)
Hemoglobin: 14.8 g/dL (ref 11.7–15.5)
Lymphs Abs: 2210 cells/uL (ref 850–3900)
MCH: 30.1 pg (ref 27.0–33.0)
MCHC: 34 g/dL (ref 32.0–36.0)
MCV: 88.4 fL (ref 80.0–100.0)
MPV: 10.1 fL (ref 7.5–12.5)
Monocytes Relative: 7.1 %
Neutro Abs: 5664 cells/uL (ref 1500–7800)
Neutrophils Relative %: 65.1 %
Platelets: 345 10*3/uL (ref 140–400)
RBC: 4.92 10*6/uL (ref 3.80–5.10)
RDW: 12.8 % (ref 11.0–15.0)
Total Lymphocyte: 25.4 %
WBC: 8.7 10*3/uL (ref 3.8–10.8)

## 2019-06-18 LAB — LIPID PANEL
Cholesterol: 184 mg/dL (ref <200)
HDL: 46 mg/dL — ABNORMAL LOW (ref >=50)
LDL Cholesterol (Calc): 104 mg/dL (calc) — ABNORMAL HIGH
Non-HDL Cholesterol (Calc): 138 mg/dL (calc) — ABNORMAL HIGH (ref <130)
Total CHOL/HDL Ratio: 4 (calc) (ref <5.0)
Triglycerides: 220 mg/dL — ABNORMAL HIGH (ref <150)

## 2019-06-18 LAB — HEMOGLOBIN A1C
Hgb A1c MFr Bld: 5.5 % of total Hgb (ref <5.7)
Mean Plasma Glucose: 111 (calc)
eAG (mmol/L): 6.2 (calc)

## 2019-06-18 LAB — TSH: TSH: 2.52 mIU/L (ref 0.40–4.50)

## 2019-06-20 ENCOUNTER — Other Ambulatory Visit: Payer: Self-pay | Admitting: *Deleted

## 2019-06-20 DIAGNOSIS — R748 Abnormal levels of other serum enzymes: Secondary | ICD-10-CM

## 2019-06-25 ENCOUNTER — Encounter: Payer: Self-pay | Admitting: Family Medicine

## 2019-07-06 ENCOUNTER — Encounter: Payer: Self-pay | Admitting: Family Medicine

## 2019-07-08 ENCOUNTER — Other Ambulatory Visit: Payer: Self-pay

## 2019-07-08 DIAGNOSIS — Z20828 Contact with and (suspected) exposure to other viral communicable diseases: Secondary | ICD-10-CM

## 2019-07-08 DIAGNOSIS — Z20822 Contact with and (suspected) exposure to covid-19: Secondary | ICD-10-CM

## 2019-07-10 LAB — NOVEL CORONAVIRUS, NAA: SARS-CoV-2, NAA: NOT DETECTED

## 2019-07-11 ENCOUNTER — Other Ambulatory Visit: Payer: Medicare Other

## 2019-07-22 ENCOUNTER — Ambulatory Visit
Admission: RE | Admit: 2019-07-22 | Discharge: 2019-07-22 | Disposition: A | Discharge status: Home / Self Care | Payer: Medicare Other | Source: Ambulatory Visit | Attending: Family Medicine | Admitting: Family Medicine

## 2019-07-22 DIAGNOSIS — R748 Abnormal levels of other serum enzymes: Secondary | ICD-10-CM

## 2019-07-25 ENCOUNTER — Ambulatory Visit
Admission: RE | Admit: 2019-07-25 | Discharge: 2019-07-25 | Disposition: A | Discharge status: Home / Self Care | Payer: Medicare Other | Source: Ambulatory Visit | Attending: Family Medicine | Admitting: Family Medicine

## 2019-07-25 ENCOUNTER — Other Ambulatory Visit: Payer: Self-pay

## 2019-07-25 DIAGNOSIS — Z1231 Encounter for screening mammogram for malignant neoplasm of breast: Secondary | ICD-10-CM

## 2019-12-05 ENCOUNTER — Other Ambulatory Visit (INDEPENDENT_AMBULATORY_CARE_PROVIDER_SITE_OTHER): Payer: Self-pay | Admitting: Family Medicine

## 2019-12-05 ENCOUNTER — Other Ambulatory Visit: Payer: Self-pay

## 2019-12-05 DIAGNOSIS — E785 Hyperlipidemia, unspecified: Secondary | ICD-10-CM

## 2019-12-05 DIAGNOSIS — I10 Essential (primary) hypertension: Secondary | ICD-10-CM

## 2019-12-05 MED ORDER — ATORVASTATIN CALCIUM 10 MG PO TABS: 10.0000 mg | ORAL_TABLET | Freq: Every day | ORAL | 0 refills | Status: DC

## 2019-12-05 MED ORDER — LISINOPRIL-HYDROCHLOROTHIAZIDE 20-12.5 MG PO TABS: 1.0000 | ORAL_TABLET | Freq: Every day | ORAL | 0 refills | Status: DC

## 2019-12-05 NOTE — Telephone Encounter (Signed)
Medication Refill - Medication: atorvastatin (LIPITOR) 10 MG tablet lisinopril-hydrochlorothiazide (PRINZIDE,ZESTORETIC) 20-12.5 MG tablet  Has the patient contacted their pharmacy? Yes - states pharmacy has had no response from office.  Pt is completely out of medication (Agent: If no, request that the patient contact the pharmacy for the refill.) (Agent: If yes, when and what did the pharmacy advise?)  Preferred Pharmacy (with phone number or street name):  [facility] [place], [place] Phone:  [phone]  Fax:  [phone]     Agent: Please be advised that RX refills may take up to 3 business days. We ask that you follow-up with your pharmacy.

## 2019-12-15 ENCOUNTER — Other Ambulatory Visit: Payer: Self-pay

## 2019-12-15 DIAGNOSIS — F329 Major depressive disorder, single episode, unspecified: Secondary | ICD-10-CM

## 2019-12-15 MED ORDER — ESCITALOPRAM OXALATE 10 MG PO TABS: 10.0000 mg | ORAL_TABLET | Freq: Every day | ORAL | 1 refills | Status: DC

## 2019-12-19 ENCOUNTER — Other Ambulatory Visit: Payer: Self-pay

## 2019-12-19 DIAGNOSIS — J301 Allergic rhinitis due to pollen: Secondary | ICD-10-CM

## 2019-12-19 DIAGNOSIS — I10 Essential (primary) hypertension: Secondary | ICD-10-CM

## 2019-12-19 MED ORDER — FLUTICASONE PROPIONATE 50 MCG/ACT NA SUSP: 2.0000 | Freq: Every day | NASAL | 11 refills | Status: DC

## 2019-12-19 MED ORDER — AMLODIPINE BESYLATE 5 MG PO TABS: 5.0000 mg | ORAL_TABLET | Freq: Every day | ORAL | 3 refills | Status: DC

## 2019-12-21 ENCOUNTER — Other Ambulatory Visit: Payer: Self-pay

## 2019-12-22 ENCOUNTER — Encounter: Payer: Self-pay | Admitting: Family Medicine

## 2019-12-22 ENCOUNTER — Ambulatory Visit (INDEPENDENT_AMBULATORY_CARE_PROVIDER_SITE_OTHER): Payer: Medicare PPO | Admitting: Family Medicine

## 2019-12-22 VITALS — BP 142/84 | HR 77 | Temp 97.3°F | Ht 66.0 in | Wt 196.8 lb

## 2019-12-22 DIAGNOSIS — M2042 Other hammer toe(s) (acquired), left foot: Secondary | ICD-10-CM

## 2019-12-22 DIAGNOSIS — K760 Fatty (change of) liver, not elsewhere classified: Secondary | ICD-10-CM

## 2019-12-22 DIAGNOSIS — I10 Essential (primary) hypertension: Secondary | ICD-10-CM

## 2019-12-22 DIAGNOSIS — E669 Obesity, unspecified: Secondary | ICD-10-CM | POA: Diagnosis not present

## 2019-12-22 DIAGNOSIS — Z8249 Family history of ischemic heart disease and other diseases of the circulatory system: Secondary | ICD-10-CM | POA: Diagnosis not present

## 2019-12-22 DIAGNOSIS — F4323 Adjustment disorder with mixed anxiety and depressed mood: Secondary | ICD-10-CM | POA: Diagnosis not present

## 2019-12-22 DIAGNOSIS — M8580 Other specified disorders of bone density and structure, unspecified site: Secondary | ICD-10-CM | POA: Diagnosis not present

## 2019-12-22 DIAGNOSIS — E782 Mixed hyperlipidemia: Secondary | ICD-10-CM

## 2019-12-22 NOTE — Progress Notes (Signed)
Subjective  CC:  Chief Complaint  Patient presents with  . Transitions Of Care    transitioning care form Dr. Juleen China  . Hypertension    HPI: Karina Ingram is a 69 y.o. female who presents to [facility] today to establish care with me as a new patient.   She has the following concerns or needs:  Very pleasant 69 yo married female, mother of 1 daughter who is recently engaged, no grandchildren at this time, presents to establish care.  I reviewed her chart in detail.  Overall she is very healthy.  She does have hypertension which she reports is very well controlled at home.  Mildly elevated reading in part due to rushing to get her husband to another appointment and meeting a new doctor.  She is on amlodipine, ACE inhibitor and diuretic.  She has no known heart disease.  She denies chest pain, shortness of breath.  She has gained a little weight and her activity is less due to the Covid restrictions and closing of the gyms but recent blood pressures reportedly 115/70 1 at the dentist last week.  Mixed hyperlipidemia on statin for greater than 5 years.  She is not certain that she needs it.  She has no adverse effects from it.  She would like to get off it and see if it is absolutely indicated.  She does have family history of heart disease.  She does not have diabetes.  She will work on eating a low-cholesterol low-fat diet  She has been on Wellbutrin and Lexapro for just over a year now.  This was started due to adjustment reaction with anxiety and depression in part due to her husband's failing and strong personality.  She since has undergone therapy.  With the medications and therapy tools she is doing much better.  She feels well without anxiety or depressive symptoms.  She has no adverse effects from the medications.  She has never before been diagnosed with depression or treated for mood problems.  Reviewed her most recent bone density from 2019 showing  stable osteopenia.  She does take calcium and vitamin D supplements.  Up until recently she did regular exercise  Hearing screening did show some mild hearing loss and she is interested in getting hearing aids.  Hammertoe left foot is bothersome.  She has seen a podiatrist in the past.  Due to pain she is interested in getting this fixed.  Assessment  1. Essential hypertension   2. Family history of premature CAD   3. Mixed hyperlipidemia   4. Adjustment reaction with anxiety and depression   5. Obesity (BMI 30-39.9)   6. Osteopenia, unspecified location   7. Hammer toe of left foot   8. Hepatic steatosis      Plan   Hypertension: Most likely is well controlled.  Patient will confirm with home readings and she will message me if there elevated.  Continue current medications.  No change in dosage at this time.  Hyperlipidemia: She probably is a good statin candidate but we elected to stop statin for 3 months and then check fasting lipid panel to recalculate her atherosclerotic disease risk score.  We will restart if indicated.  Patient agrees with this care plan.  Adjustment reaction is now well controlled.  Recommend continue medications for at least 3 more months.  Then can reassess.  Obesity: Recommend increasing activity again with home exercises, daily walks and better eating plan.  Hammertoe:  Patient will schedule appointment with her podiatrist.  She may defer this until after the Covid search has quieted down.  Follow up: Complete physical in April or May No orders of the defined types were placed in this encounter.  No orders of the defined types were placed in this encounter.    Depression screen [facility] 2/9 06/17/2019 06/17/2019 06/03/2019 12/27/2018 10/25/2018  Decreased Interest 0 0 0 0 3  Down, Depressed, Hopeless 0 0 0 0 3  PHQ - 2 Score 0 0 0 0 6  Altered sleeping 0 0 0 1 0  Tired, decreased energy 0 0 0 0 3  Change in appetite 0 0 0 0 2  Feeling bad or failure about  yourself  0 0 0 0 0  Trouble concentrating 0 0 0 0 3  Moving slowly or fidgety/restless 0 0 0 0 0  Suicidal thoughts 0 0 0 0 0  PHQ-9 Score 0 0 0 1 14  Difficult doing work/chores Not difficult at all Not difficult at all Not difficult at all Not difficult at all Very difficult    We updated and reviewed the patient's past history in detail and it is documented below.  Patient Active Problem List   Diagnosis Date Noted  . Adjustment reaction with anxiety and depression 10/24/2018    Priority: High  . Family history of premature CAD 02/19/2016    Priority: High  . Essential hypertension 12/27/2014    Priority: High  . Obesity (BMI 30-39.9) 09/02/2011    Priority: High  . Mixed hyperlipidemia 09/02/2011    Priority: High  . Hepatic steatosis 10/24/2018    Priority: Medium    abd [place] 2020   . Osteopenia 09/02/2011    Priority: Medium    03/01/18 The BMD measured at Femur Neck Right is 0.825 g/cm2 with a T-score of -1.5. This patient is considered osteopenic according to World Health Organization [facility]) criteria.  Current Outpatient Medications:  .  calcium-vitamin D (OSCAL WITH D) 500-200 MG-UNIT per tablet, Take 2 tablets by mouth daily. , Disp: , Rfl:  .  Cholecalciferol (VITAMIN D3) 1000 units CAPS, Take 1 capsule by mouth daily., Disp: , Rfl:    . Vitamin D deficiency 12/27/2014    Priority: Low  . Rhinitis, allergic 12/27/2014    Priority: Low  . Ptosis of eyelid 12/27/2014    Priority: Low   Health Maintenance  Topic Date Due  . DEXA SCAN  03/01/2020  . PNA vac Low Risk Adult (2 of 2 - PCV13) 07/05/2020  . MAMMOGRAM  07/24/2020  . TETANUS/TDAP  08/16/2020  . COLONOSCOPY  06/18/2028  . INFLUENZA VACCINE  Completed  . Hepatitis C Screening  Completed   Immunization History  Administered Date(s) Administered  . Hepatitis B 10/08/2009, 11/07/2009, 07/17/2010  . Influenza Split 09/02/2011, 09/14/2012, 09/25/2015  . Influenza Whole 08/16/2010, 10/05/2018  . Influenza,  High Dose Seasonal PF 10/05/2018, 08/29/2019  . Influenza-Unspecified 09/07/2016  . Pneumococcal Polysaccharide-23 10/25/2018, 07/06/2019  . Tdap 08/16/2010  . Zoster 12/12/2013   Current Meds  Medication Sig  . amLODipine (NORVASC) 5 MG tablet Take 1 tablet (5 mg total) by mouth daily.  Marland Kitchen aspirin 81 MG tablet Take 1 tablet (81 mg total) by mouth daily.  Marland Kitchen buPROPion (WELLBUTRIN XL) 150 MG 24 hr tablet Take 1 tablet (150 mg total) by mouth 2 (two) times daily.  . calcium-vitamin D (OSCAL WITH D) 500-200 MG-UNIT per tablet Take 2 tablets by mouth daily.   . Cholecalciferol (  VITAMIN D3) 25 MCG (1000 UT) CAPS Take 1 capsule (1,000 Units total) by mouth daily.  Marland Kitchen escitalopram (LEXAPRO) 10 MG tablet Take 1 tablet (10 mg total) by mouth daily.  . fexofenadine (ALLEGRA) 30 MG tablet Take 30 mg by mouth daily. Reported on 02/19/2016  . fluticasone (FLONASE) 50 MCG/ACT nasal spray Place 2 sprays into both nostrils daily.  Marland Kitchen lisinopril-hydrochlorothiazide (ZESTORETIC) 20-12.5 MG tablet Take 1 tablet by mouth daily.  . Multiple Vitamins-Minerals (MULTIVITAMIN WITH MINERALS) tablet Take 1 tablet by mouth daily.    . Omega-3 Fatty Acids (FISH OIL) 1000 MG CPDR Take 1 capsule by mouth daily.  . [DISCONTINUED] atorvastatin (LIPITOR) 10 MG tablet Take 1 tablet (10 mg total) by mouth daily.    Allergies: Patient is allergic to fosamax [alendronate sodium]. Past Medical History Patient  has a past medical history of Allergic rhinitis, Arthritis, Depression, Dyslipidemia, Family history of premature CAD, Hypertension, Osteopenia, DEXA 2013, 2016, Positive PPD, treated, and Vitamin D deficiency. Past Surgical History Patient  has a past surgical history that includes Tonsillectomy; Wisdom tooth extraction; Colonoscopy (2019); and Inguinal hernia repair (1980). Family History: Patient family history includes COPD in her father; Cancer in her mother; Heart disease in her father and mother; Hyperlipidemia in her  brother; Hypertension in her brother, father, and mother; Other in her sister; Renal cancer in her brother; Stroke in her father. Social History:  Patient  reports that she has never smoked. She has never used smokeless tobacco. She reports current alcohol use. She reports that she does not use drugs.  Review of Systems: Constitutional: negative for fever or malaise Ophthalmic: negative for photophobia, double vision or loss of vision Cardiovascular: negative for chest pain, dyspnea on exertion, or new LE swelling Respiratory: negative for SOB or persistent cough Gastrointestinal: negative for abdominal pain, change in bowel habits or melena Genitourinary: negative for dysuria or gross hematuria Musculoskeletal: negative for new gait disturbance or muscular weakness Integumentary: negative for new or persistent rashes Neurological: negative for TIA or stroke symptoms Psychiatric: negative for SI or delusions Allergic/Immunologic: negative for hives  Patient Care Team    Relationship Specialty Notifications Start End  Willow Ora, MD PCP - General Family Medicine  12/22/19     Objective  Vitals: BP (!) 142/84   Pulse 77   Temp (!) 97.3 F (36.3 C) (Temporal)   Ht 5\' 6"  (1.676 m)   Wt 196 lb 12.8 oz (89.3 kg)   SpO2 98%   BMI 31.76 kg/m  General:  Well developed, well nourished, no acute distress  Psych:  Alert and oriented,normal mood and affect HEENT:  Normocephalic, atraumatic, non-icteric sclera, PERRL, supple neck without adenopathy, mass or thyromegaly Cardiovascular:  RRR without gallop, rub or murmur, no pedal edema Respiratory:  Good breath sounds bilaterally, CTAB with normal respiratory effort Skin:  Warm, no rashes or suspicious lesions noted Neurologic:    Mental status is normal.    Commons side effects, risks, benefits, and alternatives for medications and treatment plan prescribed today were discussed, and the patient expressed understanding of the given  instructions. Patient is instructed to call or message via MyChart if he/she has any questions or concerns regarding our treatment plan. No barriers to understanding were identified. We discussed Red Flag symptoms and signs in detail. Patient expressed understanding regarding what to do in case of urgent or emergency type symptoms.   Medication list was reconciled, printed and provided to the patient in AVS. Patient instructions and summary information  was reviewed with the patient as documented in the AVS. This note was prepared with assistance of Dragon voice recognition software. Occasional wrong-word or sound-a-like substitutions may have occurred due to the inherent limitations of voice recognition software  This visit occurred during the SARS-CoV-2 public health emergency.  Safety protocols were in place, including screening questions prior to the visit, additional usage of staff PPE, and extensive cleaning of exam room while observing appropriate contact time as indicated for disinfecting solutions.

## 2019-12-22 NOTE — Patient Instructions (Addendum)
Please return in April or May for your annual complete physical; please come fasting.  I'm glad you are doing well.  Please check your home blood pressures and send me a message if they are > 140/90.  It looks like you will be needing a refill on your lisinopril-hydrochlorothiazide. Let me get your home readings first over the next two weeks; if they are good like usual, send me a refill request and I will refill for a year. If not, I will adjust the dose.   It was a pleasure meeting you today! Thank you for choosing [place] to meet your healthcare needs! I truly look forward to working with you. If you have any questions or concerns, please send me a message via Mychart or call the office at [phone].

## 2019-12-28 ENCOUNTER — Other Ambulatory Visit: Payer: Self-pay | Admitting: Family Medicine

## 2019-12-28 DIAGNOSIS — E785 Hyperlipidemia, unspecified: Secondary | ICD-10-CM

## 2019-12-28 DIAGNOSIS — I10 Essential (primary) hypertension: Secondary | ICD-10-CM

## 2019-12-29 NOTE — Telephone Encounter (Signed)
We stopped atorvastatin and will recheck lipids at next visit.

## 2020-01-05 ENCOUNTER — Other Ambulatory Visit: Payer: Self-pay

## 2020-01-05 DIAGNOSIS — F329 Major depressive disorder, single episode, unspecified: Secondary | ICD-10-CM

## 2020-01-05 MED ORDER — BUPROPION HCL ER (XL) 150 MG PO TB24: 150.0000 mg | ORAL_TABLET | Freq: Two times a day (BID) | ORAL | 3 refills | Status: DC

## 2020-03-27 ENCOUNTER — Telehealth: Payer: Self-pay | Admitting: Family Medicine

## 2020-03-27 NOTE — Telephone Encounter (Signed)
I left a message asking the patient to call and schedule Medicare AWV with Toni Amend [facility] Coach).  If patient calls back, please schedule Medicare Wellness Visit at next available opening. Last AWV 06/17/2019 VDM ([employer] calendar year)

## 2020-04-16 ENCOUNTER — Ambulatory Visit (INDEPENDENT_AMBULATORY_CARE_PROVIDER_SITE_OTHER): Payer: Medicare PPO

## 2020-04-16 ENCOUNTER — Encounter: Payer: Self-pay | Admitting: Family Medicine

## 2020-04-16 ENCOUNTER — Ambulatory Visit (INDEPENDENT_AMBULATORY_CARE_PROVIDER_SITE_OTHER): Payer: Medicare PPO | Admitting: Family Medicine

## 2020-04-16 ENCOUNTER — Other Ambulatory Visit: Payer: Self-pay

## 2020-04-16 VITALS — BP 134/72 | HR 78 | Temp 97.7°F | Ht 66.0 in | Wt 197.2 lb

## 2020-04-16 DIAGNOSIS — E669 Obesity, unspecified: Secondary | ICD-10-CM

## 2020-04-16 DIAGNOSIS — Z0000 Encounter for general adult medical examination without abnormal findings: Secondary | ICD-10-CM | POA: Diagnosis not present

## 2020-04-16 DIAGNOSIS — Z8249 Family history of ischemic heart disease and other diseases of the circulatory system: Secondary | ICD-10-CM

## 2020-04-16 DIAGNOSIS — F4323 Adjustment disorder with mixed anxiety and depressed mood: Secondary | ICD-10-CM

## 2020-04-16 DIAGNOSIS — M8580 Other specified disorders of bone density and structure, unspecified site: Secondary | ICD-10-CM

## 2020-04-16 DIAGNOSIS — I10 Essential (primary) hypertension: Secondary | ICD-10-CM

## 2020-04-16 DIAGNOSIS — E782 Mixed hyperlipidemia: Secondary | ICD-10-CM

## 2020-04-16 DIAGNOSIS — K760 Fatty (change of) liver, not elsewhere classified: Secondary | ICD-10-CM

## 2020-04-16 LAB — CBC WITH DIFFERENTIAL/PLATELET
Basophils Absolute: 0.1 10*3/uL (ref 0.0–0.1)
Basophils Relative: 1.2 % (ref 0.0–3.0)
Eosinophils Absolute: 0.2 10*3/uL (ref 0.0–0.7)
Eosinophils Relative: 2.6 % (ref 0.0–5.0)
HCT: 42.4 % (ref 36.0–46.0)
Hemoglobin: 14.2 g/dL (ref 12.0–15.0)
Lymphocytes Relative: 24.9 % (ref 12.0–46.0)
Lymphs Abs: 2 10*3/uL (ref 0.7–4.0)
MCHC: 33.5 g/dL (ref 30.0–36.0)
MCV: 88.3 fl (ref 78.0–100.0)
Monocytes Absolute: 0.5 10*3/uL (ref 0.1–1.0)
Monocytes Relative: 6 % (ref 3.0–12.0)
Neutro Abs: 5.2 10*3/uL (ref 1.4–7.7)
Neutrophils Relative %: 65.3 % (ref 43.0–77.0)
Platelets: 349 10*3/uL (ref 150.0–400.0)
RBC: 4.8 Mil/uL (ref 3.87–5.11)
RDW: 13 % (ref 11.5–15.5)
WBC: 8 10*3/uL (ref 4.0–10.5)

## 2020-04-16 LAB — COMPREHENSIVE METABOLIC PANEL
ALT: 26 U/L (ref 0–35)
AST: 19 U/L (ref 0–37)
Albumin: 4.3 g/dL (ref 3.5–5.2)
Alkaline Phosphatase: 102 U/L (ref 39–117)
BUN: 13 mg/dL (ref 6–23)
CO2: 30 mEq/L (ref 19–32)
Calcium: 9.7 mg/dL (ref 8.4–10.5)
Chloride: 98 mEq/L (ref 96–112)
Creatinine, Ser: 0.9 mg/dL (ref 0.40–1.20)
GFR: 62.45 mL/min (ref >60.00)
Glucose, Bld: 102 mg/dL — ABNORMAL HIGH (ref 70–99)
Potassium: 4.1 mEq/L (ref 3.5–5.1)
Sodium: 137 mEq/L (ref 135–145)
Total Bilirubin: 0.6 mg/dL (ref 0.2–1.2)
Total Protein: 6.8 g/dL (ref 6.0–8.3)

## 2020-04-16 LAB — LIPID PANEL
Cholesterol: 253 mg/dL — ABNORMAL HIGH (ref 0–200)
HDL: 43.8 mg/dL (ref >39.00)
NonHDL: 209.38
Total CHOL/HDL Ratio: 6
Triglycerides: 328 mg/dL — ABNORMAL HIGH (ref 0.0–149.0)
VLDL: 65.6 mg/dL — ABNORMAL HIGH (ref 0.0–40.0)

## 2020-04-16 LAB — TSH: TSH: 3.23 u[IU]/mL (ref 0.35–4.50)

## 2020-04-16 LAB — LDL CHOLESTEROL, DIRECT: Direct LDL: 122 mg/dL

## 2020-04-16 NOTE — Progress Notes (Signed)
Subjective:   Karina Ingram is a 69 y.o. female who presents for Medicare Annual (Subsequent) preventive examination.  Review of Systems:   Cardiac Risk Factors include: hypertension;advanced age (>5men, >75 women);dyslipidemia    Objective:     Vitals: BP 134/72   Pulse 78   Temp 97.7 F (36.5 C) (Temporal)   Ht 5\' 6"  (1.676 m)   Wt 197 lb 3.2 oz (89.4 kg)   SpO2 99%   BMI 31.83 kg/m   Body mass index is 31.83 kg/m.  Advanced Directives 04/16/2020 06/17/2019  Does Patient Have a Medical Advance Directive? Yes No  Type of Advance Directive Karina Ingram -  Does patient want to make changes to medical advance directive? No - Patient declined -  Copy of [place] in Chart? No - copy requested -  Would patient like information on creating a medical advance directive? - No - Patient declined    Tobacco Social History   Tobacco Use  Smoking Status Never Smoker  Smokeless Tobacco Never Used     Counseling given: Not Answered   Clinical Intake:  Pre-visit preparation completed: Yes  Pain : No/denies pain  Diabetes: No  How often do you need to have someone help you when you read instructions, pamphlets, or other written materials from your doctor or pharmacy?: 1 - Never  Interpreter Needed?: No  Information entered by :: Karina George LPN  Past Medical History:  Diagnosis Date  . Allergic rhinitis   . Arthritis   . Depression   . Dyslipidemia   . Family history of premature CAD   . Hypertension   . Osteopenia, DEXA 2013, 2016   . Positive PPD, treated   . Vitamin D deficiency    Past Surgical History:  Procedure Laterality Date  . COLONOSCOPY  2019  . INGUINAL HERNIA REPAIR  1980  . TONSILLECTOMY    . WISDOM TOOTH EXTRACTION     Family History  Problem Relation Age of Onset  . Cancer Mother   . Heart disease Mother   . Hypertension Mother   . Stroke Father   . Hypertension Father   . Heart disease Father    . COPD Father   . Other Sister   . Hypertension Brother   . Hyperlipidemia Brother   . Renal cancer Brother    Social History   Socioeconomic History  . Marital status: Married    Spouse name: Not on file  . Number of children: Not on file  . Years of education: Not on file  . Highest education level: Not on file  Occupational History    Employer: [employer]  Tobacco Use  . Smoking status: Never Smoker  . Smokeless tobacco: Never Used  Substance and Sexual Activity  . Alcohol use: Yes    Comment: 2-3 per month  . Drug use: No  . Sexual activity: Yes    Birth control/protection: Post-menopausal  Other Topics Concern  . 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  Volunteer at [employer] on Tuesdays, involved in church, sees friends regularly.     Hobbies: [employer], reading, knitting, mobile meals    Social Determinants of Health   Financial Resource Strain:   . Difficulty of Paying Living Expenses:  Food Insecurity:   . Worried About Programme researcher, broadcasting/film/video in the Last Year:   . Barista in the Last Year:   Transportation Needs:   . Freight forwarder (Medical):   Marland Kitchen Lack of Transportation (Non-Medical):   Physical Activity:   . Days of Exercise per Week:   . Minutes of Exercise per Session:   Stress:   . Feeling of Stress :   Social Connections:   . Frequency of Communication with Friends and Family:   . Frequency of Social Gatherings with Friends and Family:   . Attends Religious Services:   . Active Member of Clubs or Organizations:   . Attends Banker Meetings:   Marland Kitchen Marital Status:     Outpatient Encounter Medications as of 04/16/2020  Medication Sig  . amLODipine (NORVASC) 5 MG tablet Take 1 tablet (5 mg  total) by mouth daily.  Marland Kitchen aspirin 81 MG tablet Take 1 tablet (81 mg total) by mouth daily.  . calcium-vitamin D (OSCAL WITH D) 500-200 MG-UNIT per tablet Take 2 tablets by mouth daily.   . Cholecalciferol (VITAMIN D3) 25 MCG (1000 UT) CAPS Take 1 capsule (1,000 Units total) by mouth daily.  Marland Kitchen escitalopram (LEXAPRO) 10 MG tablet Take 1 tablet (10 mg total) by mouth daily.  . fexofenadine (ALLEGRA) 30 MG tablet Take 30 mg by mouth daily. Reported on 02/19/2016  . fluticasone (FLONASE) 50 MCG/ACT nasal spray Place 2 sprays into both nostrils daily.  Marland Kitchen lisinopril-hydrochlorothiazide (ZESTORETIC) 20-12.5 MG tablet TAKE 1 TABLET BY MOUTH EVERY DAY  . Multiple Vitamins-Minerals (MULTIVITAMIN WITH MINERALS) tablet Take 1 tablet by mouth daily.    . Omega-3 Fatty Acids (FISH OIL) 1000 MG CPDR Take 1 capsule by mouth daily.  . [DISCONTINUED] buPROPion (WELLBUTRIN XL) 150 MG 24 hr tablet Take 1 tablet (150 mg total) by mouth 2 (two) times daily.   No facility-administered encounter medications on file as of 04/16/2020.    Activities of Daily Living In your present state of health, do you have any difficulty performing the following activities: 04/16/2020 06/17/2019  Hearing? N N  Vision? N N  Difficulty concentrating or making decisions? N N  Walking or climbing stairs? N N  Dressing or bathing? N N  Doing errands, shopping? N N  Preparing Food and eating ? N N  Using the Toilet? N N  In the past six months, have you accidently leaked urine? N N  Do you have problems with loss of bowel control? N N  Managing your Medications? N N  Managing your Finances? N N  Housekeeping or managing your Housekeeping? N N  Some recent data might be hidden    Patient Care Team: Karina Ora, MD as PCP - General (Family Medicine) Karina Buttner, MD as Consulting Physician (Dermatology)    Assessment:   This is a routine wellness examination for Karina Ingram.  Exercise Activities and Dietary  recommendations Current Exercise Habits: Home exercise routine, Type of exercise: walking;Other - see comments(ymca), Time (Minutes): 30, Frequency (Times/Week): 4, Weekly Exercise (Minutes/Week): 120, Intensity: Mild  Goals    . Increase physical activity    . Increase water intake       Fall Risk Fall Risk  04/16/2020 12/22/2019 06/17/2019 06/17/2019 12/27/2018  Falls in the past year? 0 1 0 0 0  Number falls in past yr: 0 0 - - 0  Injury with Fall? 0 0 - - 0  Comment - - - - -  Risk for fall due to : - - - - -  Follow up Falls evaluation completed;Education provided;Falls prevention discussed Falls evaluation completed - - -   Is the patient's home free of loose throw rugs in walkways, pet beds, electrical cords, etc?   yes      Grab bars in the bathroom? yes      Handrails on the stairs?   yes      Adequate lighting?   yes  Timed Get Up and Go performed: completed and within normal timeframe; no gait abnormalities noted   Depression Screen PHQ 2/9 Scores 04/16/2020 06/17/2019 06/17/2019 06/03/2019  PHQ - 2 Score 0 0 0 0  PHQ- 9 Score - 0 0 0     Cognitive Function- no cognitive concerns at this time    6CIT Screen 04/16/2020  What Year? 0 points  What month? 0 points  What time? 0 points  Count back from 20 0 points  Months in reverse 0 points    Immunization History  Administered Date(s) Administered  . Hepatitis B 10/08/2009, 11/07/2009, 07/17/2010  . Influenza Split 09/02/2011, 09/14/2012, 09/25/2015  . Influenza Whole 08/16/2010, 10/05/2018  . Influenza, High Dose Seasonal PF 10/05/2018, 08/29/2019  . Influenza-Unspecified 09/07/2016  . PFIZER SARS-COV-2 Vaccination 01/14/2020, 02/04/2020  . Pneumococcal Polysaccharide-23 10/25/2018, 07/06/2019  . Tdap 08/16/2010  . Zoster 12/12/2013    Qualifies for Shingles Vaccine?Discussed and patient will check with pharmacy for coverage.  Patient education handout provided   Screening Tests Health Maintenance  Topic Date  Due  . DEXA SCAN  03/01/2020  . PNA vac Low Risk Adult (2 of 2 - PCV13) 07/05/2020  . INFLUENZA VACCINE  07/08/2020  . MAMMOGRAM  07/24/2020  . TETANUS/TDAP  08/16/2020  . COLONOSCOPY  06/18/2028  . COVID-19 Vaccine  Completed  . Hepatitis C Screening  Completed    Cancer Screenings: Lung: Low Dose CT Chest recommended if Age 11-80 years, 30 pack-year currently smoking OR have quit w/in 15years. Patient does not qualify. Breast:  Up to date on Mammogram? Yes   Up to date of Bone Density/Dexa? Yes Colorectal: colonoscopy 06/18/18    Plan:  I have personally reviewed and addressed the Medicare Annual Wellness questionnaire and have noted the following in the patient's chart:  A. Medical and social history B. Use of alcohol, tobacco or illicit drugs  C. Current medications and supplements D. Functional ability and status E.  Nutritional status F.  Physical activity G. Advance directives H. List of other physicians I.  Hospitalizations, surgeries, and ER visits in previous 12 months J.  Vitals K. Screenings such as hearing and vision if needed, cognitive and depression L. Referrals, records requested, and appointments- none    In addition, I have reviewed and discussed with patient certain preventive protocols, quality metrics, and best practice recommendations. A written personalized care plan for preventive services as well as general preventive health recommendations were provided to patient.   Signed,  Kandis Fantasia, LPN  Nurse Health Advisor   Nurse Notes: no additional

## 2020-04-16 NOTE — Patient Instructions (Addendum)
Please return in 6 months for hypertension follow up.  You may stop your buproproprion. We can consider coming off of your escitalopram in 2-3 months IF things remain stable.   I will release your lab results to you on your MyChart account with further instructions. Please reply with any questions.   If you have any questions or concerns, please don't hesitate to send me a message via MyChart or call the office at [phone]. Thank you for visiting with [place] today! It's our pleasure caring for you.  I have ordered a mammogram and/or bone density for you as we discussed today: Your next mammogram is due in August and you could get your Bone Density done with that at the same time if you'd like: []   Mammogram  [x]   Bone Density  Please call the office checked below to schedule your appointment: Your appointment will at the following location  [x]   [facility]      [place], [place]         []   [facility]  [place], [place]  Make sure to wear two peace clothing  No lotions powders or deodorants the day of the appointment Make sure to bring picture ID and insurance card.  Bring list of medications you are currently taking including any supplements.    Preventive Care 80 Years and Older, Female Preventive care refers to lifestyle choices and visits with your health care provider that can promote health and wellness. This includes:  A yearly physical exam. This is also called an annual well check.  Regular dental and eye exams.  Immunizations.  Screening for certain conditions.  Healthy lifestyle choices, such as diet and exercise. What can I expect for my preventive care visit? Physical exam Your health care provider will check:  Height and weight. These may be used to calculate body mass index (BMI), which is a measurement that tells if you are at a healthy  weight.  Heart rate and blood pressure.  Your skin for abnormal spots. Counseling Your health care provider may ask you questions about:  Alcohol, tobacco, and drug use.  Emotional well-being.  Home and relationship well-being.  Sexual activity.  Eating habits.  History of falls.  Memory and ability to understand (cognition).  Work and work Statistician.  Pregnancy and menstrual history. What immunizations do I need?  Influenza (flu) vaccine  This is recommended every year. Tetanus, diphtheria, and pertussis (Tdap) vaccine  You may need a Td booster every 10 years. Varicella (chickenpox) vaccine  You may need this vaccine if you have not already been vaccinated. Zoster (shingles) vaccine  You may need this after age 75. Pneumococcal conjugate (PCV13) vaccine  One dose is recommended after age 30. Pneumococcal polysaccharide (PPSV23) vaccine  One dose is recommended after age 46. Measles, mumps, and rubella (MMR) vaccine  You may need at least one dose of MMR if you were born in 1957 or later. You may also need a second dose. Meningococcal conjugate (MenACWY) vaccine  You may need this if you have certain conditions. Hepatitis A vaccine  You may need this if you have certain conditions or if you travel or work in places where you may be exposed to hepatitis A. Hepatitis B vaccine  You may need this if you have certain conditions or if you travel or  work in places where you may be exposed to hepatitis B. Haemophilus influenzae type b (Hib) vaccine  You may need this if you have certain conditions. You may receive vaccines as individual doses or as more than one vaccine together in one shot (combination vaccines). Talk with your health care provider about the risks and benefits of combination vaccines. What tests do I need? Blood tests  Lipid and cholesterol levels. These may be checked every 5 years, or more frequently depending on your overall  health.  Hepatitis C test.  Hepatitis B test. Screening  Lung cancer screening. You may have this screening every year starting at age 19 if you have a 30-pack-year history of smoking and currently smoke or have quit within the past 15 years.  Colorectal cancer screening. All adults should have this screening starting at age 50 and continuing until age 74. Your health care provider may recommend screening at age 78 if you are at increased risk. You will have tests every 1-10 years, depending on your results and the type of screening test.  Diabetes screening. This is done by checking your blood sugar (glucose) after you have not eaten for a while (fasting). You may have this done every 1-3 years.  Mammogram. This may be done every 1-2 years. Talk with your health care provider about how often you should have regular mammograms.  BRCA-related cancer screening. This may be done if you have a family history of breast, ovarian, tubal, or peritoneal cancers. Other tests  Sexually transmitted disease (STD) testing.  Bone density scan. This is done to screen for osteoporosis. You may have this done starting at age 70. Follow these instructions at home: Eating and drinking  Eat a diet that includes fresh fruits and vegetables, whole grains, lean protein, and low-fat dairy products. Limit your intake of foods with high amounts of sugar, saturated fats, and salt.  Take vitamin and mineral supplements as recommended by your health care provider.  Do not drink alcohol if your health care provider tells you not to drink.  If you drink alcohol: ? Limit how much you have to 0-1 drink a day. ? Be aware of how much alcohol is in your drink. In the [place] one drink equals one 12 oz bottle of beer (355 mL), one 5 oz glass of wine (148 mL), or one 1 oz glass of hard liquor (44 mL). Lifestyle  Take daily care of your teeth and gums.  Stay active. Exercise for at least 30 minutes on 5 or more days  each week.  Do not use any products that contain nicotine or tobacco, such as cigarettes, e-cigarettes, and chewing tobacco. If you need help quitting, ask your health care provider.  If you are sexually active, practice safe sex. Use a condom or other form of protection in order to prevent STIs (sexually transmitted infections).  Talk with your health care provider about taking a low-dose aspirin or statin. What's next?  Go to your health care provider once a year for a well check visit.  Ask your health care provider how often you should have your eyes and teeth checked.  Stay up to date on all vaccines. This information is not intended to replace advice given to you by your health care provider. Make sure you discuss any questions you have with your health care provider. Document Revised: 11/18/2018 Document Reviewed: 11/18/2018 Elsevier Patient Education  2020 [employer].

## 2020-04-16 NOTE — Progress Notes (Signed)
Subjective  Chief Complaint  Patient presents with  . Annual Exam  . Hypertension    HPI: Karina Ingram is a 69 y.o. female who presents to [facility] [place] today for a Female Wellness Visit. She also has the concerns and/or needs as listed above in the chief complaint. These will be addressed in addition to the Health Maintenance Visit.   Wellness Visit: annual visit with health maintenance review and exam without Pap   HM: due mammo in august. Due f/u dexa for mild osteopenia. Doing well! No concerns, new.  Chronic disease f/u and/or acute problem visit: (deemed necessary to be done in addition to the wellness visit):  HTN: Feeling well. Taking medications w/o adverse effects. No symptoms of CHF, angina; no palpitations, sob, cp or lower extremity edema. Compliant with meds.   Mood: remains on wellbutrin and escitalopram and feels well. No concerns for anxiety or depression at this time. Life circumstances have improved. Ready to try getting off of meds. Has never before been treated for mood d/o.   HLD: stopped statins and now fasting for recheck. Has + FH early CAD as risk factor for CAD.  No abd pain or jaundice. H/o hepatic steatosis by ultrasound. Diet and weight are stable.    Assessment  1. Annual physical exam   2. Adjustment reaction with anxiety and depression   3. Essential hypertension   4. Family history of premature CAD   5. Mixed hyperlipidemia   6. Obesity (BMI 30-39.9)   7. Hepatic steatosis   8. Osteopenia, unspecified location      Plan  Female Wellness Visit:  Age appropriate Health Maintenance and Prevention measures were discussed with patient. Included topics are cancer screening recommendations, ways to keep healthy (see AVS) including dietary and exercise recommendations, regular eye and dental care, use of seat belts, and avoidance of moderate alcohol use and tobacco use. mammo for august.   BMI: discussed patient's BMI  and encouraged positive lifestyle modifications to help get to or maintain a target BMI.  HM needs and immunizations were addressed and ordered. See below for orders. See HM and immunization section for updates.  Routine labs and screening tests ordered including cmp, cbc and lipids where appropriate.  Discussed recommendations regarding Vit D and calcium supplementation (see AVS)  Chronic disease management visit and/or acute problem visit:  HTN: well controlled. No change in meds. Check renal function and electrolytes.  HLD: fasting and will check off of medications. Restart statin if indicated. rec heart healthy diet  Mood: stop wellbutrin. If remains stable, can start wean of escitalopram in 8-12 weeks. Pt agrees. IF mood is worsening, she will let me know.  Monitor liver tests.   Recheck osteopenia with f/u dexa. Continue ca,vit D and exercise.  Follow up: Return in about 6 months (around 10/17/2020) for follow up Hypertension.  Orders Placed This Encounter  Procedures  . DG Bone Density  . CBC with Differential/Platelet  . Comprehensive metabolic panel  . Lipid panel  . TSH   No orders of the defined types were placed in this encounter.     Lifestyle: Body mass index is 31.83 kg/m. Wt Readings from Last 3 Encounters:  04/16/20 197 lb 3.2 oz (89.4 kg)  04/16/20 197 lb 3.2 oz (89.4 kg)  12/22/19 196 lb 12.8 oz (89.3 kg)    Patient Active Problem List   Diagnosis Date Noted  . Adjustment reaction with anxiety and depression 10/24/2018  Priority: High  . Family history of premature CAD 02/19/2016    Priority: High  . Essential hypertension 12/27/2014    Priority: High  . Obesity (BMI 30-39.9) 09/02/2011    Priority: High  . Mixed hyperlipidemia 09/02/2011    Priority: High  . Hepatic steatosis 10/24/2018    Priority: Medium    abd [place] 2020   . Osteopenia 09/02/2011    Priority: Medium    03/01/18 The BMD measured at Femur Neck Right is 0.825 g/cm2 with a  T-score of -1.5. This patient is considered osteopenic according to World Health Organization [facility]) criteria.  Current Outpatient Medications:  .  calcium-vitamin D (OSCAL WITH D) 500-200 MG-UNIT per tablet, Take 2 tablets by mouth daily. , Disp: , Rfl:  .  Cholecalciferol (VITAMIN D3) 1000 units CAPS, Take 1 capsule by mouth daily., Disp: , Rfl:    . Vitamin D deficiency 12/27/2014    Priority: Low  . Rhinitis, allergic 12/27/2014    Priority: Low  . Ptosis of eyelid 12/27/2014    Priority: Low   Health Maintenance  Topic Date Due  . DEXA SCAN  03/01/2020  . PNA vac Low Risk Adult (2 of 2 - PCV13) 07/05/2020  . INFLUENZA VACCINE  07/08/2020  . MAMMOGRAM  07/24/2020  . TETANUS/TDAP  08/16/2020  . COLONOSCOPY  06/18/2028  . COVID-19 Vaccine  Completed  . Hepatitis C Screening  Completed   Immunization History  Administered Date(s) Administered  . Hepatitis B 10/08/2009, 11/07/2009, 07/17/2010  . Influenza Split 09/02/2011, 09/14/2012, 09/25/2015  . Influenza Whole 08/16/2010, 10/05/2018  . Influenza, High Dose Seasonal PF 10/05/2018, 08/29/2019  . Influenza-Unspecified 09/07/2016  . PFIZER SARS-COV-2 Vaccination 01/14/2020, 02/04/2020  . Pneumococcal Polysaccharide-23 10/25/2018, 07/06/2019  . Tdap 08/16/2010  . Zoster 12/12/2013   We updated and reviewed the patient's past history in detail and it is documented below. Allergies: Patient is allergic to fosamax [alendronate sodium]. Past Medical History Patient  has a past medical history of Allergic rhinitis, Arthritis, Depression, Dyslipidemia, Family history of premature CAD, Hypertension, Osteopenia, DEXA 2013, 2016, Positive PPD, treated, and Vitamin D deficiency. Past Surgical History Patient  has a past surgical history that includes Tonsillectomy; Wisdom tooth extraction; Colonoscopy (2019); and Inguinal hernia repair (1980). Family History: Patient family history includes COPD in her father; Cancer in her mother;  Heart disease in her father and mother; Hyperlipidemia in her brother; Hypertension in her brother, father, and mother; Other in her sister; Renal cancer in her brother; Stroke in her father. Social History:  Patient  reports that she has never smoked. She has never used smokeless tobacco. She reports current alcohol use. She reports that she does not use drugs.  Review of Systems: Constitutional: negative for fever or malaise Ophthalmic: negative for photophobia, double vision or loss of vision Cardiovascular: negative for chest pain, dyspnea on exertion, or new LE swelling Respiratory: negative for SOB or persistent cough Gastrointestinal: negative for abdominal pain, change in bowel habits or melena Genitourinary: negative for dysuria or gross hematuria, no abnormal uterine bleeding or disharge Musculoskeletal: negative for new gait disturbance or muscular weakness Integumentary: negative for new or persistent rashes, no breast lumps Neurological: negative for TIA or stroke symptoms Psychiatric: negative for SI or delusions Allergic/Immunologic: negative for hives  Patient Care Team    Relationship Specialty Notifications Start End  Willow Ora, MD PCP - General Family Medicine  12/22/19   Bufford Buttner, MD Consulting Physician Dermatology  04/16/20     Objective  Vitals: BP 134/72   Pulse 78   Temp 97.7 F (36.5 C) (Temporal)   Ht 5\' 6"  (1.676 m)   Wt 197 lb 3.2 oz (89.4 kg)   SpO2 99%   BMI 31.83 kg/m  General:  Well developed, well nourished, no acute distress  Psych:  Alert and orientedx3,normal mood and affect HEENT:  Normocephalic, atraumatic, non-icteric sclera,  supple neck without adenopathy, mass or thyromegaly Cardiovascular:  Normal S1, S2, RRR without gallop, rub or murmur Respiratory:  Good breath sounds bilaterally, CTAB with normal respiratory effort Gastrointestinal: normal bowel sounds, soft, non-tender, no noted masses. No HSM MSK: no deformities,  contusions. Joints are without erythema or swelling.  Skin:  Warm, no rashes or suspicious lesions noted Neurologic:    Mental status is normal. CN 2-11 are normal. Gross motor and sensory exams are normal. Normal gait. No tremor Breast Exam: No mass, skin retraction or nipple discharge is appreciated in either breast. No axillary adenopathy. Fibrocystic changes are not noted    Commons side effects, risks, benefits, and alternatives for medications and treatment plan prescribed today were discussed, and the patient expressed understanding of the given instructions. Patient is instructed to call or message via MyChart if he/she has any questions or concerns regarding our treatment plan. No barriers to understanding were identified. We discussed Red Flag symptoms and signs in detail. Patient expressed understanding regarding what to do in case of urgent or emergency type symptoms.   Medication list was reconciled, printed and provided to the patient in AVS. Patient instructions and summary information was reviewed with the patient as documented in the AVS. This note was prepared with assistance of Dragon voice recognition software. Occasional wrong-word or sound-a-like substitutions may have occurred due to the inherent limitations of voice recognition software  This visit occurred during the SARS-CoV-2 public health emergency.  Safety protocols were in place, including screening questions prior to the visit, additional usage of staff PPE, and extensive cleaning of exam room while observing appropriate contact time as indicated for disinfecting solutions.

## 2020-04-16 NOTE — Patient Instructions (Signed)
Ms. Karina Ingram , Thank you for taking time to come for your Medicare Wellness Visit. I appreciate your ongoing commitment to your health goals. Please review the following plan we discussed and let me know if I can assist you in the future.   Screening recommendations/referrals: Colorectal Screening: up to date; last colonoscopy 06/18/18 Mammogram: up to date; last 07/25/19 Bone Density: last 03/01/18  Vision and Dental Exams: Recommended annual ophthalmology exams for early detection of glaucoma and other disorders of the eye Recommended annual dental exams for proper oral hygiene  Vaccinations: Influenza vaccine: completed 08/29/19 Pneumococcal vaccine: up to date; last 07/06/19 (Prevnar due after 07/05/20) Tdap vaccine: up to date; last 08/16/10 Shingles vaccine: You may receive this vaccine at your local pharmacy. (see handout)  Covid vaccine: Completed   Advanced directives: Please bring a copy of your POA (Power of Attorney) and/or Living Will to your next appointment.  Goals: Recommend to drink at least 6-8 8oz glasses of water per day and consume a balanced diet rich in fresh fruits and vegetables.   Next appointment: Please schedule your Annual Wellness Visit with your Nurse Health Advisor in one year.  Preventive Care 36 Years and Older, Female Preventive care refers to lifestyle choices and visits with your health care provider that can promote health and wellness. What does preventive care include?  A yearly physical exam. This is also called an annual well check.  Dental exams once or twice a year.  Routine eye exams. Ask your health care provider how often you should have your eyes checked.  Personal lifestyle choices, including:  Daily care of your teeth and gums.  Regular physical activity.  Eating a healthy diet.  Avoiding tobacco and drug use.  Limiting alcohol use.  Practicing safe sex.  Taking low-dose aspirin every day if recommended by your health care  provider.  Taking vitamin and mineral supplements as recommended by your health care provider. What happens during an annual well check? The services and screenings done by your health care provider during your annual well check will depend on your age, overall health, lifestyle risk factors, and family history of disease. Counseling  Your health care provider may ask you questions about your:  Alcohol use.  Tobacco use.  Drug use.  Emotional well-being.  Home and relationship well-being.  Sexual activity.  Eating habits.  History of falls.  Memory and ability to understand (cognition).  Work and work Astronomer.  Reproductive health. Screening  You may have the following tests or measurements:  Height, weight, and BMI.  Blood pressure.  Lipid and cholesterol levels. These may be checked every 5 years, or more frequently if you are over 60 years old.  Skin check.  Lung cancer screening. You may have this screening every year starting at age 36 if you have a 30-pack-year history of smoking and currently smoke or have quit within the past 15 years.  Fecal occult blood test (FOBT) of the stool. You may have this test every year starting at age 19.  Flexible sigmoidoscopy or colonoscopy. You may have a sigmoidoscopy every 5 years or a colonoscopy every 10 years starting at age 74.  Hepatitis C blood test.  Hepatitis B blood test.  Sexually transmitted disease (STD) testing.  Diabetes screening. This is done by checking your blood sugar (glucose) after you have not eaten for a while (fasting). You may have this done every 1-3 years.  Bone density scan. This is done to screen for osteoporosis. You may  have this done starting at age 72.  Mammogram. This may be done every 1-2 years. Talk to your health care provider about how often you should have regular mammograms. Talk with your health care provider about your test results, treatment options, and if necessary, the  need for more tests. Vaccines  Your health care provider may recommend certain vaccines, such as:  Influenza vaccine. This is recommended every year.  Tetanus, diphtheria, and acellular pertussis (Tdap, Td) vaccine. You may need a Td booster every 10 years.  Zoster vaccine. You may need this after age 85.  Pneumococcal 13-valent conjugate (PCV13) vaccine. One dose is recommended after age 29.  Pneumococcal polysaccharide (PPSV23) vaccine. One dose is recommended after age 60. Talk to your health care provider about which screenings and vaccines you need and how often you need them. This information is not intended to replace advice given to you by your health care provider. Make sure you discuss any questions you have with your health care provider. Document Released: 12/21/2015 Document Revised: 08/13/2016 Document Reviewed: 09/25/2015 Elsevier Interactive Patient Education  2017 [employer] Prevention in the Home Falls can cause injuries. They can happen to people of all ages. There are many things you can do to make your home safe and to help prevent falls. What can I do on the outside of my home?  Regularly fix the edges of walkways and driveways and fix any cracks.  Remove anything that might make you trip as you walk through a door, such as a raised step or threshold.  Trim any bushes or trees on the path to your home.  Use bright outdoor lighting.  Clear any walking paths of anything that might make someone trip, such as rocks or tools.  Regularly check to see if handrails are loose or broken. Make sure that both sides of any steps have handrails.  Any raised decks and porches should have guardrails on the edges.  Have any leaves, snow, or ice cleared regularly.  Use sand or salt on walking paths during winter.  Clean up any spills in your garage right away. This includes oil or grease spills. What can I do in the bathroom?  Use night lights.  Install grab  bars by the toilet and in the tub and shower. Do not use towel bars as grab bars.  Use non-skid mats or decals in the tub or shower.  If you need to sit down in the shower, use a plastic, non-slip stool.  Keep the floor dry. Clean up any water that spills on the floor as soon as it happens.  Remove soap buildup in the tub or shower regularly.  Attach bath mats securely with double-sided non-slip rug tape.  Do not have throw rugs and other things on the floor that can make you trip. What can I do in the bedroom?  Use night lights.  Make sure that you have a light by your bed that is easy to reach.  Do not use any sheets or blankets that are too big for your bed. They should not hang down onto the floor.  Have a firm chair that has side arms. You can use this for support while you get dressed.  Do not have throw rugs and other things on the floor that can make you trip. What can I do in the kitchen?  Clean up any spills right away.  Avoid walking on wet floors.  Keep items that you use a lot in  easy-to-reach places.  If you need to reach something above you, use a strong step stool that has a grab bar.  Keep electrical cords out of the way.  Do not use floor polish or wax that makes floors slippery. If you must use wax, use non-skid floor wax.  Do not have throw rugs and other things on the floor that can make you trip. What can I do with my stairs?  Do not leave any items on the stairs.  Make sure that there are handrails on both sides of the stairs and use them. Fix handrails that are broken or loose. Make sure that handrails are as long as the stairways.  Check any carpeting to make sure that it is firmly attached to the stairs. Fix any carpet that is loose or worn.  Avoid having throw rugs at the top or bottom of the stairs. If you do have throw rugs, attach them to the floor with carpet tape.  Make sure that you have a light switch at the top of the stairs and the  bottom of the stairs. If you do not have them, ask someone to add them for you. What else can I do to help prevent falls?  Wear shoes that:  Do not have high heels.  Have rubber bottoms.  Are comfortable and fit you well.  Are closed at the toe. Do not wear sandals.  If you use a stepladder:  Make sure that it is fully opened. Do not climb a closed stepladder.  Make sure that both sides of the stepladder are locked into place.  Ask someone to hold it for you, if possible.  Clearly mark and make sure that you can see:  Any grab bars or handrails.  First and last steps.  Where the edge of each step is.  Use tools that help you move around (mobility aids) if they are needed. These include:  Canes.  Walkers.  Scooters.  Crutches.  Turn on the lights when you go into a dark area. Replace any light bulbs as soon as they burn out.  Set up your furniture so you have a clear path. Avoid moving your furniture around.  If any of your floors are uneven, fix them.  If there are any pets around you, be aware of where they are.  Review your medicines with your doctor. Some medicines can make you feel dizzy. This can increase your chance of falling. Ask your doctor what other things that you can do to help prevent falls. This information is not intended to replace advice given to you by your health care provider. Make sure you discuss any questions you have with your health care provider. Document Released: 09/20/2009 Document Revised: 05/01/2016 Document Reviewed: 12/29/2014 Elsevier Interactive Patient Education  2017 [employer].

## 2020-04-19 ENCOUNTER — Encounter: Payer: Self-pay | Admitting: Family Medicine

## 2020-04-19 MED ORDER — ATORVASTATIN CALCIUM 10 MG PO TABS: 10.0000 mg | ORAL_TABLET | Freq: Every day | ORAL | 1 refills | Status: DC

## 2020-05-16 DIAGNOSIS — D2261 Melanocytic nevi of right upper limb, including shoulder: Secondary | ICD-10-CM | POA: Diagnosis not present

## 2020-05-16 DIAGNOSIS — L280 Lichen simplex chronicus: Secondary | ICD-10-CM | POA: Diagnosis not present

## 2020-05-16 DIAGNOSIS — L814 Other melanin hyperpigmentation: Secondary | ICD-10-CM | POA: Diagnosis not present

## 2020-05-16 DIAGNOSIS — D2372 Other benign neoplasm of skin of left lower limb, including hip: Secondary | ICD-10-CM | POA: Diagnosis not present

## 2020-05-16 DIAGNOSIS — D225 Melanocytic nevi of trunk: Secondary | ICD-10-CM | POA: Diagnosis not present

## 2020-05-16 DIAGNOSIS — D1801 Hemangioma of skin and subcutaneous tissue: Secondary | ICD-10-CM | POA: Diagnosis not present

## 2020-05-16 DIAGNOSIS — L821 Other seborrheic keratosis: Secondary | ICD-10-CM | POA: Diagnosis not present

## 2020-05-16 DIAGNOSIS — D2262 Melanocytic nevi of left upper limb, including shoulder: Secondary | ICD-10-CM | POA: Diagnosis not present

## 2020-05-16 DIAGNOSIS — L570 Actinic keratosis: Secondary | ICD-10-CM | POA: Diagnosis not present

## 2020-05-25 ENCOUNTER — Other Ambulatory Visit: Payer: Self-pay

## 2020-05-25 ENCOUNTER — Ambulatory Visit
Admission: RE | Admit: 2020-05-25 | Discharge: 2020-05-25 | Disposition: A | Discharge status: Home / Self Care | Payer: Medicare PPO | Source: Ambulatory Visit | Attending: Family Medicine | Admitting: Family Medicine

## 2020-05-25 DIAGNOSIS — M8589 Other specified disorders of bone density and structure, multiple sites: Secondary | ICD-10-CM | POA: Diagnosis not present

## 2020-05-25 DIAGNOSIS — Z780 Asymptomatic menopausal state: Secondary | ICD-10-CM | POA: Diagnosis not present

## 2020-05-25 DIAGNOSIS — M8580 Other specified disorders of bone density and structure, unspecified site: Secondary | ICD-10-CM

## 2020-06-11 ENCOUNTER — Encounter: Payer: Self-pay | Admitting: Family Medicine

## 2020-06-12 ENCOUNTER — Other Ambulatory Visit: Payer: Self-pay | Admitting: Family Medicine

## 2020-06-12 DIAGNOSIS — F329 Major depressive disorder, single episode, unspecified: Secondary | ICD-10-CM

## 2020-06-14 ENCOUNTER — Other Ambulatory Visit: Payer: Self-pay | Admitting: Family Medicine

## 2020-06-14 DIAGNOSIS — Z1231 Encounter for screening mammogram for malignant neoplasm of breast: Secondary | ICD-10-CM

## 2020-06-19 ENCOUNTER — Encounter: Payer: Medicare Other | Admitting: Family Medicine

## 2020-07-11 ENCOUNTER — Encounter: Payer: Self-pay | Admitting: Family Medicine

## 2020-07-25 ENCOUNTER — Ambulatory Visit
Admission: RE | Admit: 2020-07-25 | Discharge: 2020-07-25 | Disposition: A | Discharge status: Home / Self Care | Payer: Medicare PPO | Source: Ambulatory Visit | Attending: Family Medicine | Admitting: Family Medicine

## 2020-07-25 ENCOUNTER — Other Ambulatory Visit: Payer: Self-pay

## 2020-07-25 DIAGNOSIS — Z1231 Encounter for screening mammogram for malignant neoplasm of breast: Secondary | ICD-10-CM | POA: Diagnosis not present

## 2020-08-25 ENCOUNTER — Encounter: Payer: Self-pay | Admitting: Family Medicine

## 2020-10-07 ENCOUNTER — Other Ambulatory Visit: Payer: Self-pay | Admitting: Family Medicine

## 2020-10-18 ENCOUNTER — Encounter: Payer: Self-pay | Admitting: Family Medicine

## 2020-10-18 ENCOUNTER — Ambulatory Visit: Payer: Medicare PPO | Admitting: Family Medicine

## 2020-10-18 ENCOUNTER — Other Ambulatory Visit: Payer: Self-pay

## 2020-10-18 VITALS — BP 126/84 | HR 71 | Temp 97.3°F | Ht 66.0 in | Wt 192.8 lb

## 2020-10-18 DIAGNOSIS — Z7189 Other specified counseling: Secondary | ICD-10-CM

## 2020-10-18 DIAGNOSIS — I10 Essential (primary) hypertension: Secondary | ICD-10-CM | POA: Diagnosis not present

## 2020-10-18 DIAGNOSIS — M170 Bilateral primary osteoarthritis of knee: Secondary | ICD-10-CM | POA: Diagnosis not present

## 2020-10-18 DIAGNOSIS — F4323 Adjustment disorder with mixed anxiety and depressed mood: Secondary | ICD-10-CM

## 2020-10-18 MED ORDER — DICLOFENAC SODIUM 75 MG PO TBEC
75.0000 mg | DELAYED_RELEASE_TABLET | Freq: Two times a day (BID) | ORAL | 0 refills | Status: DC
Start: 2020-10-18 — End: 2021-04-18

## 2020-10-18 MED ORDER — AMLODIPINE BESYLATE 5 MG PO TABS: 5.0000 mg | ORAL_TABLET | Freq: Every day | ORAL | 3 refills | Status: DC

## 2020-10-18 MED ORDER — LISINOPRIL-HYDROCHLOROTHIAZIDE 20-12.5 MG PO TABS: 1.0000 | ORAL_TABLET | Freq: Every day | ORAL | 3 refills | Status: DC

## 2020-10-18 NOTE — Patient Instructions (Signed)
Please return in 6 months for your annual complete physical; please come fasting.  After the holidays, you may wean off of the lexapro as follows:  Take 1/2 tablet daily for one week, Then 1/2 tablet MWF for one week Then 1/2 tablet M and F, then stop  If you have any questions or concerns, please don't hesitate to send me a message via MyChart or call the office at [phone]. Thank you for visiting with [place] today! It's our pleasure caring for you.   Chronic Knee Pain, Adult Chronic knee pain is pain in one or both knees that lasts longer than 3 months. Symptoms of chronic knee pain may include swelling, stiffness, and discomfort. Age-related wear and tear (osteoarthritis) of the knee joint is the most common cause of chronic knee pain. Other possible causes include:  A long-term immune-related disease that causes inflammation of the knee (rheumatoid arthritis). This usually affects both knees.  Inflammatory arthritis, such as gout or pseudogout.  An injury to the knee that causes arthritis.  An injury to the knee that damages the ligaments. Ligaments are strong tissues that connect bones to each other.  Runner's knee or pain behind the kneecap. Treatment for chronic knee pain depends on the cause. The main treatments for chronic knee pain are physical therapy and weight loss. This condition may also be treated with medicines, injections, a knee sleeve or brace, and by using crutches. Rest, ice, compression (pressure), and elevation (RICE) therapy may also be recommended. Follow these instructions at home: If you have a knee sleeve or brace:   Wear it as told by your health care provider. Remove it only as told by your health care provider.  Loosen it if your toes tingle, become numb, or turn cold and blue.  Keep it clean.  If the sleeve or brace is not waterproof: ? Do not let it get wet. ? Remove it if allowed by your health care provider, or cover it with a watertight  covering when you take a bath or a shower. Managing pain, stiffness, and swelling      If directed, apply heat to the affected area as often as told by your health care provider. Use the heat source that your health care provider recommends, such as a moist heat pack or a heating pad. ? If you have a removable sleeve or brace, remove it as told by your health care provider. ? Place a towel between your skin and the heat source. ? Leave the heat on for 20-30 minutes. ? Remove the heat if your skin turns bright red. This is especially important if you are unable to feel pain, heat, or cold. You may have a greater risk of getting burned.  If directed, put ice on the affected area. ? If you have a removable sleeve or brace, remove it as told by your health care provider. ? Put ice in a plastic bag. ? Place a towel between your skin and the bag. ? Leave the ice on for 20 minutes, 2-3 times a day.  Move your toes often to reduce stiffness and swelling.  Raise (elevate) the injured area above the level of your heart while you are sitting or lying down. Activity  Avoid activities where both feet leave the ground at the same time (high-impact activities). Examples are running, jumping rope, and doing jumping jacks.  Return to your normal activities as told by your health care provider. Ask your health care provider what activities are safe for  you.  Follow the exercise plan that your health care provider designed for you. Your health care provider may suggest that you: ? Avoid activities that make knee pain worse. This may require you to change your exercise routines, sport participation, or job duties. ? Wear shoes with cushioned soles. ? Avoid high-impact activities or sports that require running and sudden changes in direction. ? Do physical therapy as told by your health care provider. Physical therapy is planned to match your needs and abilities. It may include exercises for strength,  flexibility, stability, and endurance. ? Do exercises that increase balance and strength, such as tai chi and yoga.  Do not use the injured limb to support your body weight until your health care provider says that you can. Use crutches, a cane, or a walker, as told by your health care provider. General instructions  Take over-the-counter and prescription medicines only as told by your health care provider.  Lose weight if you are overweight. Losing even a little weight can reduce knee pain. Ask your health care provider what your ideal weight is, and how to safely lose extra weight. A food expert (dietitian) may be able to help you plan your meals.  Do not use any products that contain nicotine or tobacco, such as cigarettes, e-cigarettes, and chewing tobacco. These can delay healing. If you need help quitting, ask your health care provider.  Keep all follow-up visits as told by your health care provider. This is important. Contact a health care provider if:  You have knee pain that is not getting better or gets worse.  You are unable to do your physical therapy exercises due to knee pain. Get help right away if:  Your knee swells and the swelling becomes worse.  You cannot move your knee.  You have severe knee pain. Summary  Knee pain that lasts more than 3 months is considered chronic knee pain.  The main treatments for chronic knee pain are physical therapy and weight loss. You may also need to take medicines, wear a knee sleeve or brace, use crutches, and apply ice or heat.  Losing even a little weight can reduce knee pain. Ask your health care provider what your ideal weight is, and how to safely lose extra weight. A food expert (dietitian) may be able to help you plan your meals.  Work with a physical therapist to make a safe exercise program, as told by your health care provider. This information is not intended to replace advice given to you by your health care provider.  Make sure you discuss any questions you have with your health care provider. Document Revised: 02/03/2019 Document Reviewed: 02/03/2019 Elsevier Patient Education  [employer].

## 2020-10-18 NOTE — Progress Notes (Signed)
Subjective  CC:  Chief Complaint  Patient presents with  . Hypertension    paitent doesn't take her BP at home  . Depression    she wants to discuss her Escitalopram   . Knee Pain    L knee pain, she states that she does have arthiritis in her knee but it's beginning to interfere with her day to day life  . Covid Lifestyle    she just wants to talk about covid lifestyle because it's very "confusing"    HPI: Karina Ingram is a 69 y.o. female who presents to the office today to address the problems listed above in the chief complaint.  Hypertension f/u: Control is fair . Pt reports she is doing well. taking medications as instructed, no medication side effects noted, no TIAs, no chest pain on exertion, no dyspnea on exertion, no swelling of ankles. She denies adverse effects from his BP medications. Compliance with medication is good.   Left knee pain for 2 weeks.  She had x-rays in 2018 showing tricompartmental DJD of bilateral knees, left greater than right.  Over the last 2 weeks she is having sharp medial pain worse with walking.  She exercises regularly.  Pain is limited her walking.  No new injuries.  She denies swelling warmth or redness.  No calf swelling.  She is taking some Tylenol with mild relief.  No locking or give way.  Sharp pains are moderate to severe  Situational adjustment anxiety and depression: We stopped Wellbutrin 6 months ago.  She is done very well with that.  Feels her symptoms are currently well controlled.  Will ultimately like to wean off of Lexapro.  Has questions about Covid, Covid risk, risk avoidance and immunizations.  She is fully vaccinated and has had her booster  Assessment  1. Essential hypertension   2. Adjustment reaction with anxiety and depression   3. Tricompartment osteoarthritis of knees, bilateral   4. Counseled about COVID-19 virus infection      Plan   Hypertension f/u: BP control is well controlled. Continue current meds w/o  changes  Depression f/u: Currently very well controlled.  Agree she can likely come off of medications as this was her first episode and was situational in nature.  Recommend waiting until after the holidays.  See after visit summary for suggested weaning schedule.  Osteoarthritis of the knee: Flare.  Ice, knee sleeve and 2-week course of NSAIDs.  Follow-up here if not improving.  Counseling done regarding Covid and and decreasing risk factors.  Reassured. Education regarding management of these chronic disease states was given. Management strategies discussed on successive visits include dietary and exercise recommendations, goals of achieving and maintaining IBW, and lifestyle modifications aiming for adequate sleep and minimizing stressors.   Follow up: 6 months for complete physical and follow-up hypertension  No orders of the defined types were placed in this encounter.  Meds ordered this encounter  Medications  . diclofenac (VOLTAREN) 75 MG EC tablet    Sig: Take 1 tablet (75 mg total) by mouth 2 (two) times daily.    Dispense:  30 tablet    Refill:  0  . lisinopril-hydrochlorothiazide (ZESTORETIC) 20-12.5 MG tablet    Sig: Take 1 tablet by mouth daily.    Dispense:  90 tablet    Refill:  3    Please keep on file for next refill due january  . amLODipine (NORVASC) 5 MG tablet    Sig: Take 1 tablet (5 mg  total) by mouth daily.    Dispense:  90 tablet    Refill:  3    Please keep on file for next refill due January      BP Readings from Last 3 Encounters:  10/18/20 126/84  04/16/20 134/72  04/16/20 134/72   Wt Readings from Last 3 Encounters:  10/18/20 192 lb 12.8 oz (87.5 kg)  04/16/20 197 lb 3.2 oz (89.4 kg)  04/16/20 197 lb 3.2 oz (89.4 kg)    Lab Results  Component Value Date   CHOL 253 (H) 04/16/2020   CHOL 184 06/17/2019   CHOL 160 02/26/2018   Lab Results  Component Value Date   HDL 43.80 04/16/2020   HDL 46 (L) 06/17/2019   HDL 54.60 02/26/2018   Lab  Results  Component Value Date   LDLCALC 104 (H) 06/17/2019   LDLCALC 77 02/26/2018   LDLCALC 80 02/19/2016   Lab Results  Component Value Date   TRIG 328.0 (H) 04/16/2020   TRIG 220 (H) 06/17/2019   TRIG 142.0 02/26/2018   Lab Results  Component Value Date   CHOLHDL 6 04/16/2020   CHOLHDL 4.0 06/17/2019   CHOLHDL 3 02/26/2018   Lab Results  Component Value Date   LDLDIRECT 122.0 04/16/2020   LDLDIRECT 72.0 02/23/2017   Lab Results  Component Value Date   CREATININE 0.90 04/16/2020   BUN 13 04/16/2020   NA 137 04/16/2020   K 4.1 04/16/2020   CL 98 04/16/2020   CO2 30 04/16/2020    The 10-year ASCVD risk score Denman George DC Jr., et al., 2013) is: 10.6%   Values used to calculate the score:     Age: 89 years     Sex: Female     Is Non-Hispanic African American: No     Diabetic: No     Tobacco smoker: No     Systolic Blood Pressure: 126 mmHg     Is BP treated: Yes     HDL Cholesterol: 43.8 mg/dL     Total Cholesterol: 253 mg/dL  I reviewed the patients updated PMH, FH, and SocHx.    Patient Active Problem List   Diagnosis Date Noted  . Adjustment reaction with anxiety and depression 10/24/2018    Priority: High  . Family history of premature CAD 02/19/2016    Priority: High  . Essential hypertension 12/27/2014    Priority: High  . Obesity (BMI 30-39.9) 09/02/2011    Priority: High  . Mixed hyperlipidemia 09/02/2011    Priority: High  . Hepatic steatosis 10/24/2018    Priority: Medium  . Osteopenia 09/02/2011    Priority: Medium  . Vitamin D deficiency 12/27/2014    Priority: Low  . Rhinitis, allergic 12/27/2014    Priority: Low  . Ptosis of eyelid 12/27/2014    Priority: Low  . Tricompartment osteoarthritis of knees, bilateral 10/18/2020    Allergies: Fosamax [alendronate sodium]  Social History: Patient  reports that she has never smoked. She has never used smokeless tobacco. She reports current alcohol use. She reports that she does not use  drugs.  Current Meds  Medication Sig  . amLODipine (NORVASC) 5 MG tablet Take 1 tablet (5 mg total) by mouth daily.  Marland Kitchen aspirin 81 MG tablet Take 1 tablet (81 mg total) by mouth daily.  Marland Kitchen atorvastatin (LIPITOR) 10 MG tablet TAKE 1 TABLET BY MOUTH EVERY DAY  . calcium-vitamin D (OSCAL WITH D) 500-200 MG-UNIT per tablet Take 2 tablets by mouth daily.   . Cholecalciferol (  VITAMIN D3) 25 MCG (1000 UT) CAPS Take 1 capsule (1,000 Units total) by mouth daily.  Marland Kitchen escitalopram (LEXAPRO) 10 MG tablet TAKE 1 TABLET BY MOUTH EVERY DAY  . fexofenadine (ALLEGRA) 30 MG tablet Take 30 mg by mouth daily. Reported on 02/19/2016  . fluticasone (FLONASE) 50 MCG/ACT nasal spray Place 2 sprays into both nostrils daily.  . Hydroquinone-HC-Tretinoin 6-0.5-0.025 % EMUL Apply topically.  Marland Kitchen lisinopril-hydrochlorothiazide (ZESTORETIC) 20-12.5 MG tablet Take 1 tablet by mouth daily.  . Multiple Vitamins-Minerals (MULTIVITAMIN WITH MINERALS) tablet Take 1 tablet by mouth daily.    . Omega-3 Fatty Acids (FISH OIL) 1000 MG CPDR Take 1 capsule by mouth daily.  . [DISCONTINUED] amLODipine (NORVASC) 5 MG tablet Take 1 tablet (5 mg total) by mouth daily.  . [DISCONTINUED] lisinopril-hydrochlorothiazide (ZESTORETIC) 20-12.5 MG tablet TAKE 1 TABLET BY MOUTH EVERY DAY    Review of Systems: Cardiovascular: negative for chest pain, palpitations, leg swelling, orthopnea Respiratory: negative for SOB, wheezing or persistent cough Gastrointestinal: negative for abdominal pain Genitourinary: negative for dysuria or gross hematuria  Objective  Vitals: BP 126/84   Pulse 71   Temp (!) 97.3 F (36.3 C) (Temporal)   Ht 5\' 6"  (1.676 m)   Wt 192 lb 12.8 oz (87.5 kg)   SpO2 96%   BMI 31.12 kg/m  General: no acute distress  Psych:  Alert and oriented, normal mood and affect HEENT:  Normocephalic, atraumatic, supple neck  Cardiovascular:  RRR without murmur. no edema Respiratory:  Good breath sounds bilaterally, CTAB with normal  respiratory effort Skin:  Warm, no rashes Neurologic:   Mental status is normal  Commons side effects, risks, benefits, and alternatives for medications and treatment plan prescribed today were discussed, and the patient expressed understanding of the given instructions. Patient is instructed to call or message via MyChart if he/she has any questions or concerns regarding our treatment plan. No barriers to understanding were identified. We discussed Red Flag symptoms and signs in detail. Patient expressed understanding regarding what to do in case of urgent or emergency type symptoms.   Medication list was reconciled, printed and provided to the patient in AVS. Patient instructions and summary information was reviewed with the patient as documented in the AVS. This note was prepared with assistance of Dragon voice recognition software. Occasional wrong-word or sound-a-like substitutions may have occurred due to the inherent limitations of voice recognition software  This visit occurred during the SARS-CoV-2 public health emergency.  Safety protocols were in place, including screening questions prior to the visit, additional usage of staff PPE, and extensive cleaning of exam room while observing appropriate contact time as indicated for disinfecting solutions.

## 2020-11-14 ENCOUNTER — Encounter: Payer: Self-pay | Admitting: Family Medicine

## 2020-11-27 DIAGNOSIS — Z20822 Contact with and (suspected) exposure to covid-19: Secondary | ICD-10-CM | POA: Diagnosis not present

## 2020-12-30 ENCOUNTER — Other Ambulatory Visit: Payer: Self-pay | Admitting: Family Medicine

## 2020-12-30 DIAGNOSIS — J301 Allergic rhinitis due to pollen: Secondary | ICD-10-CM

## 2020-12-31 ENCOUNTER — Encounter: Payer: Self-pay | Admitting: Family Medicine

## 2021-01-02 ENCOUNTER — Other Ambulatory Visit: Payer: Self-pay | Admitting: Family Medicine

## 2021-01-02 DIAGNOSIS — J301 Allergic rhinitis due to pollen: Secondary | ICD-10-CM

## 2021-01-02 NOTE — Telephone Encounter (Signed)
FYI

## 2021-02-06 DIAGNOSIS — H903 Sensorineural hearing loss, bilateral: Secondary | ICD-10-CM | POA: Diagnosis not present

## 2021-03-12 ENCOUNTER — Other Ambulatory Visit: Payer: Self-pay | Admitting: Family Medicine

## 2021-03-12 DIAGNOSIS — J301 Allergic rhinitis due to pollen: Secondary | ICD-10-CM

## 2021-03-22 ENCOUNTER — Other Ambulatory Visit: Payer: Self-pay | Admitting: Family Medicine

## 2021-04-18 ENCOUNTER — Ambulatory Visit (INDEPENDENT_AMBULATORY_CARE_PROVIDER_SITE_OTHER): Payer: Medicare PPO | Admitting: Family Medicine

## 2021-04-18 ENCOUNTER — Encounter: Payer: Self-pay | Admitting: Family Medicine

## 2021-04-18 ENCOUNTER — Other Ambulatory Visit: Payer: Self-pay

## 2021-04-18 ENCOUNTER — Ambulatory Visit (INDEPENDENT_AMBULATORY_CARE_PROVIDER_SITE_OTHER): Payer: Medicare PPO

## 2021-04-18 VITALS — BP 130/78 | HR 87 | Temp 97.4°F | Wt 192.2 lb

## 2021-04-18 VITALS — BP 130/78 | HR 87 | Temp 97.4°F | Ht 66.0 in | Wt 192.0 lb

## 2021-04-18 DIAGNOSIS — Z0000 Encounter for general adult medical examination without abnormal findings: Secondary | ICD-10-CM

## 2021-04-18 DIAGNOSIS — M170 Bilateral primary osteoarthritis of knee: Secondary | ICD-10-CM

## 2021-04-18 DIAGNOSIS — K760 Fatty (change of) liver, not elsewhere classified: Secondary | ICD-10-CM | POA: Diagnosis not present

## 2021-04-18 DIAGNOSIS — E669 Obesity, unspecified: Secondary | ICD-10-CM | POA: Diagnosis not present

## 2021-04-18 DIAGNOSIS — I10 Essential (primary) hypertension: Secondary | ICD-10-CM | POA: Diagnosis not present

## 2021-04-18 DIAGNOSIS — E782 Mixed hyperlipidemia: Secondary | ICD-10-CM

## 2021-04-18 DIAGNOSIS — Z8249 Family history of ischemic heart disease and other diseases of the circulatory system: Secondary | ICD-10-CM

## 2021-04-18 LAB — CBC WITH DIFFERENTIAL/PLATELET
Basophils Absolute: 0.1 10*3/uL (ref 0.0–0.1)
Basophils Relative: 1.3 % (ref 0.0–3.0)
Eosinophils Absolute: 0.2 10*3/uL (ref 0.0–0.7)
Eosinophils Relative: 2.6 % (ref 0.0–5.0)
HCT: 45.3 % (ref 36.0–46.0)
Hemoglobin: 15.2 g/dL — ABNORMAL HIGH (ref 12.0–15.0)
Lymphocytes Relative: 29.3 % (ref 12.0–46.0)
Lymphs Abs: 2.3 10*3/uL (ref 0.7–4.0)
MCHC: 33.5 g/dL (ref 30.0–36.0)
MCV: 89.4 fl (ref 78.0–100.0)
Monocytes Absolute: 0.5 10*3/uL (ref 0.1–1.0)
Monocytes Relative: 6.4 % (ref 3.0–12.0)
Neutro Abs: 4.7 10*3/uL (ref 1.4–7.7)
Neutrophils Relative %: 60.4 % (ref 43.0–77.0)
Platelets: 320 10*3/uL (ref 150.0–400.0)
RBC: 5.06 Mil/uL (ref 3.87–5.11)
RDW: 12.9 % (ref 11.5–15.5)
WBC: 7.7 10*3/uL (ref 4.0–10.5)

## 2021-04-18 LAB — COMPREHENSIVE METABOLIC PANEL
ALT: 20 U/L (ref 0–35)
AST: 16 U/L (ref 0–37)
Albumin: 4.4 g/dL (ref 3.5–5.2)
Alkaline Phosphatase: 83 U/L (ref 39–117)
BUN: 17 mg/dL (ref 6–23)
CO2: 33 mEq/L — ABNORMAL HIGH (ref 19–32)
Calcium: 9.8 mg/dL (ref 8.4–10.5)
Chloride: 101 mEq/L (ref 96–112)
Creatinine, Ser: 0.89 mg/dL (ref 0.40–1.20)
GFR: 66.8 mL/min (ref >60.00)
Glucose, Bld: 109 mg/dL — ABNORMAL HIGH (ref 70–99)
Potassium: 3.8 mEq/L (ref 3.5–5.1)
Sodium: 141 mEq/L (ref 135–145)
Total Bilirubin: 0.7 mg/dL (ref 0.2–1.2)
Total Protein: 6.8 g/dL (ref 6.0–8.3)

## 2021-04-18 LAB — LIPID PANEL
Cholesterol: 167 mg/dL (ref 0–200)
HDL: 50.4 mg/dL (ref >39.00)
NonHDL: 116.35
Total CHOL/HDL Ratio: 3
Triglycerides: 231 mg/dL — ABNORMAL HIGH (ref 0.0–149.0)
VLDL: 46.2 mg/dL — ABNORMAL HIGH (ref 0.0–40.0)

## 2021-04-18 LAB — TSH: TSH: 2.4 u[IU]/mL (ref 0.35–4.50)

## 2021-04-18 LAB — LDL CHOLESTEROL, DIRECT: Direct LDL: 72 mg/dL

## 2021-04-18 NOTE — Progress Notes (Signed)
Subjective  Chief Complaint  Patient presents with  . Annual Exam    Had black coffee  . Hypertension    Readings at home have been running in the 150's.   . Osteoarthritis    Knees, Left thumb , right wrist. Voltaren gel helps a little    HPI: Karina Ingram is a 70 y.o. female who presents to [facility] [place] today for a Female Wellness Visit. She also has the concerns and/or needs as listed above in the chief complaint. These will be addressed in addition to the Health Maintenance Visit.   Wellness Visit: annual visit with health maintenance review and exam without Pap   HM: Screens are up-to-date.  Had annual wellness visit this morning.  Reports she had the Shingrix vaccination and will get [place] the dates.  Has been eating a healthy breakfast daily in hopes this has improved her cholesterol levels.  Her weight is trending downwards.  She is happy about this.  Mood is excellent.  Daughter is getting married in the fall.  Husband's health is mildly worse.  Having gait abnormalities.  This causes some stress but she seems to be coping well. Chronic disease f/u and/or acute problem visit: (deemed necessary to be done in addition to the wellness visit):  Hypertension: Well-controlled on current medications.  No chest pain shortness of breath or lower extremity edema  Makes hyperlipidemia on statin.  Tolerating well.  Osteoarthritis: Multiple sites.  Now with first MCP and left hand, wrist pain and left knee pain however knee pain is very intermittent.  Last flare, treated with Voltaren gel successfully.  No red hot swollen joints.  History of fatty liver.  No jaundice or right upper quadrant pain.  Trying to eat a low-fat diet.  Assessment  1. Annual physical exam   2. Essential hypertension   3. Mixed hyperlipidemia   4. Obesity (BMI 30-39.9)   5. Family history of premature CAD   6. Hepatic steatosis   7. Tricompartment osteoarthritis of knees, bilateral       Plan  Female Wellness Visit:  Age appropriate Health Maintenance and Prevention measures were discussed with patient. Included topics are cancer screening recommendations, ways to keep healthy (see AVS) including dietary and exercise recommendations, regular eye and dental care, use of seat belts, and avoidance of moderate alcohol use and tobacco use.  Screens are up-to-date.  Sees an annual dermatologist  BMI: discussed patient's BMI and encouraged positive lifestyle modifications to help get to or maintain a target BMI.  HM needs and immunizations were addressed and ordered. See below for orders. See HM and immunization section for updates.  Up-to-date, discussed fourth COVID booster recommendations  Routine labs and screening tests ordered including cmp, cbc and lipids where appropriate.  Discussed recommendations regarding Vit D and calcium supplementation (see AVS)  Chronic disease management visit and/or acute problem visit:  Hypertension is well controlled.  Continue Zestoretic 20/12.5 daily.  Check renal function electrolytes.  Hyperlipidemia on statin.  Recheck liver test and lipids.  LDL goal less than 100.  Low-fat diet recommended.  Obesity: Steadily trending downward.  Follow-up on liver function test.  No symptoms of cirrhosis present.  Osteoarthritis: Multiple sites.  Managing well with over-the-counter Voltaren gel and Tylenol as needed.   Follow up: 6 months for blood pressure recheck No orders of the defined types were placed in this encounter.  No orders of the defined types were placed in this encounter.  Body mass index is 30.99 kg/m. Wt Readings from Last 3 Encounters:  04/18/21 192 lb (87.1 kg)  04/18/21 192 lb 3.2 oz (87.2 kg)  10/18/20 192 lb 12.8 oz (87.5 kg)     Patient Active Problem List   Diagnosis Date Noted  . Adjustment reaction with anxiety and depression 10/24/2018    Priority: High  . Family history of premature CAD  02/19/2016    Priority: High  . Essential hypertension 12/27/2014    Priority: High  . Obesity (BMI 30-39.9) 09/02/2011    Priority: High  . Mixed hyperlipidemia 09/02/2011    Priority: High  . Tricompartment osteoarthritis of knees, bilateral 10/18/2020    Priority: Medium  . Hepatic steatosis 10/24/2018    Priority: Medium    abd [place] 2020   . Osteopenia 09/02/2011    Priority: Medium    03/01/18 The BMD measured at Femur Neck Right is 0.825 g/cm2 with a T-score of -1.5. This patient is considered osteopenic according to World Health Organization [facility]) criteria. 06/2020: T = -1.5, stable osteopenia. Recheck 3 years.    . Vitamin D deficiency 12/27/2014    Priority: Low  . Rhinitis, allergic 12/27/2014    Priority: Low  . Ptosis of eyelid 12/27/2014    Priority: Low   Health Maintenance  Topic Date Due  . COVID-19 Vaccine (3 - Booster for Pfizer series) 07/03/2020  . TETANUS/TDAP  08/16/2020  . INFLUENZA VACCINE  07/08/2021  . MAMMOGRAM  07/25/2021  . DEXA SCAN  05/26/2023  . COLONOSCOPY (Pts 45-2yrs Insurance coverage will need to be confirmed)  06/18/2028  . Hepatitis C Screening  Completed  . PNA vac Low Risk Adult  Completed  . HPV VACCINES  Aged Out   Immunization History  Administered Date(s) Administered  . Hepatitis B 10/08/2009, 11/07/2009, 07/17/2010  . Influenza Split 09/02/2011, 09/14/2012, 09/25/2015  . Influenza Whole 08/16/2010, 10/05/2018  . Influenza, High Dose Seasonal PF 10/05/2018, 08/29/2019, 08/28/2020  . Influenza-Unspecified 09/07/2016  . PFIZER(Purple Top)SARS-COV-2 Vaccination 01/14/2020, 02/04/2020  . Pneumococcal Conjugate-13 07/14/2020  . Pneumococcal Polysaccharide-23 10/25/2018, 07/06/2019  . Tdap 08/16/2010  . Zoster 12/12/2013   We updated and reviewed the patient's past history in detail and it is documented below. Allergies: Patient is allergic to fosamax [alendronate sodium]. Past Medical History Patient  has a past medical  history of Allergic rhinitis, Arthritis, Depression, Dyslipidemia, Family history of premature CAD, Hypertension, Osteopenia, DEXA 2013, 2016, Positive PPD, treated, and Vitamin D deficiency. Past Surgical History Patient  has a past surgical history that includes Tonsillectomy; Wisdom tooth extraction; Colonoscopy (2019); and Inguinal hernia repair (1980). Family History: Patient family history includes COPD in her father; Cancer in her mother; Heart disease in her father and mother; Hyperlipidemia in her brother; Hypertension in her brother, father, and mother; Other in her sister; Renal cancer in her brother; Stroke in her father. Social History:  Patient  reports that she has never smoked. She has never used smokeless tobacco. She reports current alcohol use. She reports that she does not use drugs.  Review of Systems: Constitutional: negative for fever or malaise Ophthalmic: negative for photophobia, double vision or loss of vision Cardiovascular: negative for chest pain, dyspnea on exertion, or new LE swelling Respiratory: negative for SOB or persistent cough Gastrointestinal: negative for abdominal pain, change in bowel habits or melena Genitourinary: negative for dysuria or gross hematuria, no abnormal uterine bleeding or disharge Musculoskeletal: negative for new gait disturbance or muscular weakness Integumentary: negative for new or persistent  rashes, no breast lumps Neurological: negative for TIA or stroke symptoms Psychiatric: negative for SI or delusions Allergic/Immunologic: negative for hives  Patient Care Team    Relationship Specialty Notifications Start End  Willow Ora, MD PCP - General Family Medicine  12/22/19   Bufford Buttner, MD Consulting Physician Dermatology  04/16/20     Objective  Vitals: BP 130/78   Pulse 87   Temp (!) 97.4 F (36.3 C) (Temporal)   Ht 5\' 6"  (1.676 m)   Wt 192 lb (87.1 kg)   SpO2 97%   BMI 30.99 kg/m  General:  Well developed,  well nourished, no acute distress  Psych:  Alert and orientedx3,normal mood and affect HEENT:  Normocephalic, atraumatic, non-icteric sclera,  supple neck without adenopathy, mass or thyromegaly Cardiovascular:  Normal S1, S2, RRR without gallop, rub or murmur Respiratory:  Good breath sounds bilaterally, CTAB with normal respiratory effort Gastrointestinal: normal bowel sounds, soft, non-tender, no noted masses. No HSM MSK: no deformities, contusions. Joints are without erythema or swelling.  Skin:  Warm, no rashes or suspicious lesions noted Neurologic:    Mental status is normal. CN 2-11 are normal. Gross motor and sensory exams are normal. Normal gait. No tremor Breast Exam: No mass, skin retraction or nipple discharge is appreciated in either breast. No axillary adenopathy. Fibrocystic changes are not noted    Commons side effects, risks, benefits, and alternatives for medications and treatment plan prescribed today were discussed, and the patient expressed understanding of the given instructions. Patient is instructed to call or message via MyChart if he/she has any questions or concerns regarding our treatment plan. No barriers to understanding were identified. We discussed Red Flag symptoms and signs in detail. Patient expressed understanding regarding what to do in case of urgent or emergency type symptoms.   Medication list was reconciled, printed and provided to the patient in AVS. Patient instructions and summary information was reviewed with the patient as documented in the AVS. This note was prepared with assistance of Dragon voice recognition software. Occasional wrong-word or sound-a-like substitutions may have occurred due to the inherent limitations of voice recognition software  This visit occurred during the SARS-CoV-2 public health emergency.  Safety protocols were in place, including screening questions prior to the visit, additional usage of staff PPE, and extensive cleaning  of exam room while observing appropriate contact time as indicated for disinfecting solutions.

## 2021-04-18 NOTE — Patient Instructions (Signed)
Please return in 6 months for hypertension follow up.  I will release your lab results to you on your MyChart account with further instructions. Please reply with any questions.  Please send [place] the dates for your shingrix and covid vaccines.   If you have any questions or concerns, please don't hesitate to send me a message via MyChart or call the office at [phone]. Thank you for visiting with [place] today! It's our pleasure caring for you.   Preventive Care 69 Years and Older, Female Preventive care refers to lifestyle choices and visits with your health care provider that can promote health and wellness. This includes:  A yearly physical exam. This is also called an annual wellness visit.  Regular dental and eye exams.  Immunizations.  Screening for certain conditions.  Healthy lifestyle choices, such as: ? Eating a healthy diet. ? Getting regular exercise. ? Not using drugs or products that contain nicotine and tobacco. ? Limiting alcohol use. What can I expect for my preventive care visit? Physical exam Your health care provider will check your:  Height and weight. These may be used to calculate your BMI (body mass index). BMI is a measurement that tells if you are at a healthy weight.  Heart rate and blood pressure.  Body temperature.  Skin for abnormal spots. Counseling Your health care provider may ask you questions about your:  Past medical problems.  Family's medical history.  Alcohol, tobacco, and drug use.  Emotional well-being.  Home life and relationship well-being.  Sexual activity.  Diet, exercise, and sleep habits.  History of falls.  Memory and ability to understand (cognition).  Work and work Statistician.  Pregnancy and menstrual history.  Access to firearms. What immunizations do I need? Vaccines are usually given at various ages, according to a schedule. Your health care provider will recommend vaccines for you based on your age,  medical history, and lifestyle or other factors, such as travel or where you work.   What tests do I need? Blood tests  Lipid and cholesterol levels. These may be checked every 5 years, or more often depending on your overall health.  Hepatitis C test.  Hepatitis B test. Screening  Lung cancer screening. You may have this screening every year starting at age 51 if you have a 30-pack-year history of smoking and currently smoke or have quit within the past 15 years.  Colorectal cancer screening. ? All adults should have this screening starting at age 69 and continuing until age 46. ? Your health care provider may recommend screening at age 58 if you are at increased risk. ? You will have tests every 1-10 years, depending on your results and the type of screening test.  Diabetes screening. ? This is done by checking your blood sugar (glucose) after you have not eaten for a while (fasting). ? You may have this done every 1-3 years.  Mammogram. ? This may be done every 1-2 years. ? Talk with your health care provider about how often you should have regular mammograms.  Abdominal aortic aneurysm (AAA) screening. You may need this if you are a current or former smoker.  BRCA-related cancer screening. This may be done if you have a family history of breast, ovarian, tubal, or peritoneal cancers. Other tests  STD (sexually transmitted disease) testing, if you are at risk.  Bone density scan. This is done to screen for osteoporosis. You may have this done starting at age 55. Talk with your health care provider about  your test results, treatment options, and if necessary, the need for more tests. Follow these instructions at home: Eating and drinking  Eat a diet that includes fresh fruits and vegetables, whole grains, lean protein, and low-fat dairy products. Limit your intake of foods with high amounts of sugar, saturated fats, and salt.  Take vitamin and mineral supplements as  recommended by your health care provider.  Do not drink alcohol if your health care provider tells you not to drink.  If you drink alcohol: ? Limit how much you have to 0-1 drink a day. ? Be aware of how much alcohol is in your drink. In the [place] one drink equals one 12 oz bottle of beer (355 mL), one 5 oz glass of wine (148 mL), or one 1 oz glass of hard liquor (44 mL).   Lifestyle  Take daily care of your teeth and gums. Brush your teeth every morning and night with fluoride toothpaste. Floss one time each day.  Stay active. Exercise for at least 30 minutes 5 or more days each week.  Do not use any products that contain nicotine or tobacco, such as cigarettes, e-cigarettes, and chewing tobacco. If you need help quitting, ask your health care provider.  Do not use drugs.  If you are sexually active, practice safe sex. Use a condom or other form of protection in order to prevent STIs (sexually transmitted infections).  Talk with your health care provider about taking a low-dose aspirin or statin.  Find healthy ways to cope with stress, such as: ? Meditation, yoga, or listening to music. ? Journaling. ? Talking to a trusted person. ? Spending time with friends and family. Safety  Always wear your seat belt while driving or riding in a vehicle.  Do not drive: ? If you have been drinking alcohol. Do not ride with someone who has been drinking. ? When you are tired or distracted. ? While texting.  Wear a helmet and other protective equipment during sports activities.  If you have firearms in your house, make sure you follow all gun safety procedures. What's next?  Visit your health care provider once a year for an annual wellness visit.  Ask your health care provider how often you should have your eyes and teeth checked.  Stay up to date on all vaccines. This information is not intended to replace advice given to you by your health care provider. Make sure you discuss any  questions you have with your health care provider. Document Revised: 11/14/2020 Document Reviewed: 11/18/2018 Elsevier Patient Education  2021 [employer].

## 2021-04-18 NOTE — Progress Notes (Addendum)
Subjective:   Karina Ingram is a 70 y.o. female who presents for Medicare Annual (Subsequent) preventive examination.  Review of Systems     Cardiac Risk Factors include: advanced age (>7255men, 46>65 women);hypertension;dyslipidemia;obesity (BMI >30kg/m2)     Objective:    Today's Vitals   04/18/21 0754  BP: 130/78  Pulse: 87  Temp: (!) 97.4 F (36.3 C)  SpO2: 97%  Weight: 192 lb 3.2 oz (87.2 kg)   Body mass index is 31.02 kg/m.  Advanced Directives 04/18/2021 04/16/2020 06/17/2019  Does Patient Have a Medical Advance Directive? Yes Yes No  Type of Advance Directive Living will Healthcare Power of Attorney -  Does patient want to make changes to medical advance directive? - No - Patient declined -  Copy of Healthcare Power of Attorney in Chart? - No - copy requested -  Would patient like information on creating a medical advance directive? - - No - Patient declined    Current Medications (verified) Outpatient Encounter Medications as of 04/18/2021  Medication Sig  . amLODipine (NORVASC) 5 MG tablet Take 1 tablet (5 mg total) by mouth daily.  Marland Kitchen. aspirin 81 MG tablet Take 1 tablet (81 mg total) by mouth daily.  Marland Kitchen. atorvastatin (LIPITOR) 10 MG tablet TAKE 1 TABLET BY MOUTH EVERY DAY  . calcium-vitamin D (OSCAL WITH D) 500-200 MG-UNIT per tablet Take 2 tablets by mouth daily.  . Cholecalciferol (VITAMIN D3) 25 MCG (1000 UT) CAPS Take 1 capsule (1,000 Units total) by mouth daily.  . diclofenac Sodium (VOLTAREN) 1 % GEL Apply topically 4 (four) times daily.  . fexofenadine (ALLEGRA) 30 MG tablet Take 30 mg by mouth daily. Reported on 02/19/2016  . fluticasone (FLONASE) 50 MCG/ACT nasal spray SPRAY 2 SPRAYS INTO EACH NOSTRIL EVERY DAY  . lisinopril-hydrochlorothiazide (ZESTORETIC) 20-12.5 MG tablet Take 1 tablet by mouth daily.  . Multiple Vitamins-Minerals (MULTIVITAMIN WITH MINERALS) tablet Take 1 tablet by mouth daily.  . Omega-3 Fatty Acids (FISH OIL) 1000 MG CPDR Take 1 capsule  by mouth daily.  Marland Kitchen. TART CHERRY PO Take by mouth.  . [DISCONTINUED] diclofenac (VOLTAREN) 75 MG EC tablet Take 1 tablet (75 mg total) by mouth 2 (two) times daily.  . [DISCONTINUED] escitalopram (LEXAPRO) 10 MG tablet TAKE 1 TABLET BY MOUTH EVERY DAY  . [DISCONTINUED] Hydroquinone-HC-Tretinoin 6-0.5-0.025 % EMUL Apply topically. (Patient not taking: Reported on 04/18/2021)   No facility-administered encounter medications on file as of 04/18/2021.    Allergies (verified) Fosamax [alendronate sodium]   History: Past Medical History:  Diagnosis Date  . Allergic rhinitis   . Arthritis   . Depression   . Dyslipidemia   . Family history of premature CAD   . Hypertension   . Osteopenia, DEXA 2013, 2016   . Positive PPD, treated   . Vitamin D deficiency    Past Surgical History:  Procedure Laterality Date  . COLONOSCOPY  2019  . INGUINAL HERNIA REPAIR  1980  . TONSILLECTOMY    . WISDOM TOOTH EXTRACTION     Family History  Problem Relation Age of Onset  . Cancer Mother   . Heart disease Mother   . Hypertension Mother   . Stroke Father   . Hypertension Father   . Heart disease Father   . COPD Father   . Other Sister   . Hypertension Brother   . Hyperlipidemia Brother   . Renal cancer Brother    Social History   Socioeconomic History  . Marital status: Married  Spouse name: Not on file  . Number of children: Not on file  . Years of education: Not on file  . Highest education level: Not on file  Occupational History    Employer: [employer]  Tobacco Use  . Smoking status: Never Smoker  . Smokeless tobacco: Never Used  Substance and Sexual Activity  . Alcohol use: Yes    Comment: 2-3 per month  . Drug use: No  . Sexual activity: Yes    Birth control/protection: Post-menopausal  Other Topics Concern  . 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  Volunteer at [employer] on Tuesdays, involved in church, sees friends regularly.     Hobbies: [employer], reading, knitting, mobile meals    Social Determinants of Health   Financial Resource Strain: Low Risk   . Difficulty of Paying Living Expenses: Not hard at all  Food Insecurity: No Food Insecurity  . Worried About Programme researcher, broadcasting/film/video in the Last Year: Never true  . Ran Out of Food in the Last Year: Never true  Transportation Needs: No Transportation Needs  . Lack of Transportation (Medical): No  . Lack of Transportation (Non-Medical): No  Physical Activity: Sufficiently Active  . Days of Exercise per Week: 6 days  . Minutes of Exercise per Session: 90 min  Stress: No Stress Concern Present  . Feeling of Stress : Only a little  Social Connections: Socially Integrated  . Frequency of Communication with Friends and Family: More than three times a week  . Frequency of Social Gatherings with Friends and Family: More than three times a week  . Attends Religious Services: More than 4 times per year  . Active Member of Clubs or Organizations: Yes  . Attends Banker Meetings: 1 to 4 times per year  . Marital Status: Married    Tobacco Counseling Counseling given: Not Answered   Clinical Intake:  Pre-visit preparation completed: Yes  Pain : No/denies pain     BMI - recorded: 31.02 Nutritional Status: BMI > 30  Obese Nutritional Risks: None Diabetes: No  How often do you need to have someone help you when you read instructions, pamphlets, or other written materials from your doctor or pharmacy?: 1 - Never  Diabetic?No  Interpreter Needed?: No  Information entered by :: Lanier Ensign, LPN   Activities of Daily Living In your present state of health, do you have any difficulty performing the following activities: 04/18/2021  Hearing? Y  Vision? N   Difficulty concentrating or making decisions? N  Walking or climbing stairs? Y  Comment realted to knee hurting  Dressing or bathing? N  Doing errands, shopping? N  Preparing Food and eating ? N  Using the Toilet? N  In the past six months, have you accidently leaked urine? Y  Comment weras a pad at times  Do you have problems with loss of bowel control? N  Managing your Medications? N  Managing your Finances? N  Housekeeping or managing your Housekeeping? N  Some recent data might be hidden    Patient Care Team: Willow Ora, MD as PCP - General (Family Medicine) Bufford Buttner, MD as Consulting Physician (Dermatology)  Indicate any recent Medical Services you may have received from other than [facility] providers in the past year (date  may be approximate).     Assessment:   This is a routine wellness examination for Karina Ingram.  Hearing/Vision screen  Hearing Screening   125Hz  250Hz  500Hz  1000Hz  2000Hz  3000Hz  4000Hz  6000Hz  8000Hz   Right ear:           Left ear:           Comments: Pt states mild loss   Vision Screening Comments: Pt follows up with [facility] for annual eye exams   Dietary issues and exercise activities discussed: Current Exercise Habits: Home exercise routine, Type of exercise: walking;Other - see comments ([place]), Time (Minutes): > 60, Frequency (Times/Week): 6, Weekly Exercise (Minutes/Week): 0  Goals Addressed            This Visit's Progress   . Patient Stated       Lose weight and stay healthy      Depression Screen PHQ 2/9 Scores 04/18/2021 04/16/2020 06/17/2019 06/17/2019 06/03/2019 12/27/2018 10/25/2018  PHQ - 2 Score 1 0 0 0 0 0 6  PHQ- 9 Score - - 0 0 0 1 14    Fall Risk Fall Risk  04/18/2021 04/16/2020 12/22/2019 06/17/2019 06/17/2019  Falls in the past year? 1 0 1 0 0  Number falls in past yr: 1 0 0 - -  Injury with Fall? 0 0 0 - -  Comment - - - - -  Risk for fall due to : Impaired vision - - - -  Follow up Falls prevention discussed  Falls evaluation completed;Education provided;Falls prevention discussed Falls evaluation completed - -    FALL RISK PREVENTION PERTAINING TO THE HOME:  Any stairs in or around the home? Yes  If so, are there any without handrails? Yes  Home free of loose throw rugs in walkways, pet beds, electrical cords, etc? Yes  Adequate lighting in your home to reduce risk of falls? Yes   ASSISTIVE DEVICES UTILIZED TO PREVENT FALLS:  Life alert? No  Use of a cane, walker or w/c? No  Grab bars in the bathroom? No  Shower chair or bench in shower? No  Elevated toilet seat or a handicapped toilet? No   TIMED UP AND GO:  Was the test performed? Yes .  Length of time to ambulate 10 feet:  10 sec.   Gait steady and fast without use of assistive device  Cognitive Function:     6CIT Screen 04/18/2021 04/16/2020  What Year? 0 points 0 points  What month? 0 points 0 points  What time? - 0 points  Count back from 20 0 points 0 points  Months in reverse 0 points 0 points  Repeat phrase 2 points -    Immunizations Immunization History  Administered Date(s) Administered  . Hepatitis B 10/08/2009, 11/07/2009, 07/17/2010  . Influenza Split 09/02/2011, 09/14/2012, 09/25/2015  . Influenza Whole 08/16/2010, 10/05/2018  . Influenza, High Dose Seasonal PF 10/05/2018, 08/29/2019, 08/28/2020  . Influenza-Unspecified 09/07/2016  . PFIZER(Purple Top)SARS-COV-2 Vaccination 01/14/2020, 02/04/2020  . Pneumococcal Conjugate-13 07/14/2020  . Pneumococcal Polysaccharide-23 10/25/2018, 07/06/2019  . Tdap 08/16/2010  . Zoster 12/12/2013    TDAP status: Due, Education has been provided regarding the importance of this vaccine. Advised may receive this vaccine at local pharmacy or Health Dept. Aware to provide a copy of the vaccination record if obtained from local pharmacy or Health Dept. Verbalized acceptance and understanding.  Flu Vaccine status: Up to date  Pneumococcal vaccine status: Up to  date  Covid-19 vaccine status: Completed vaccines  Qualifies  for Shingles Vaccine? Yes   Zostavax completed Yes   Shingrix Completed?: No.    Education has been provided regarding the importance of this vaccine. Patient has been advised to call insurance company to determine out of pocket expense if they have not yet received this vaccine. Advised may also receive vaccine at local pharmacy or Health Dept. Verbalized acceptance and understanding.  Screening Tests Health Maintenance  Topic Date Due  . COVID-19 Vaccine (3 - Booster for Pfizer series) 07/03/2020  . TETANUS/TDAP  08/16/2020  . INFLUENZA VACCINE  07/08/2021  . MAMMOGRAM  07/25/2021  . DEXA SCAN  05/26/2023  . COLONOSCOPY (Pts 45-52yrs Insurance coverage will need to be confirmed)  06/18/2028  . Hepatitis C Screening  Completed  . PNA vac Low Risk Adult  Completed  . HPV VACCINES  Aged Out    Health Maintenance  Health Maintenance Due  Topic Date Due  . COVID-19 Vaccine (3 - Booster for Pfizer series) 07/03/2020  . TETANUS/TDAP  08/16/2020    Colorectal cancer screening: Type of screening: Colonoscopy. Completed 06/18/18. Repeat every 10 years  Mammogram status: Completed 07/25/20. Repeat every year  Bone Density status: Completed 05/25/20. Results reflect: Bone density results: OSTEOPENIA. Repeat every 3 years.    Additional Screening:  Hepatitis C Screening:  Completed 01/22/15  Vision Screening: Recommended annual ophthalmology exams for early detection of glaucoma and other disorders of the eye. Is the patient up to date with their annual eye exam?  Yes  Who is the provider or what is the name of the office in which the patient attends annual eye exams? [facility]  If pt is not established with a provider, would they like to be referred to a provider to establish care? No .   Dental Screening: Recommended annual dental exams for proper oral hygiene  Community Resource Referral / Chronic Care Management: CRR  required this visit?  No   CCM required this visit?  No      Plan:     I have personally reviewed and noted the following in the patient's chart:   . Medical and social history . Use of alcohol, tobacco or illicit drugs  . Current medications and supplements including opioid prescriptions.  . Functional ability and status . Nutritional status . Physical activity . Advanced directives . List of other physicians . Hospitalizations, surgeries, and ER visits in previous 12 months . Vitals . Screenings to include cognitive, depression, and falls . Referrals and appointments  In addition, I have reviewed and discussed with patient certain preventive protocols, quality metrics, and best practice recommendations. A written personalized care plan for preventive services as well as general preventive health recommendations were provided to patient.     Marzella Schlein, LPN   [id number]   Nurse Notes: None

## 2021-04-18 NOTE — Patient Instructions (Addendum)
Karina Ingram , Thank you for taking time to come for your Medicare Wellness Visit. I appreciate your ongoing commitment to your health goals. Please review the following plan we discussed and let me know if I can assist you in the future.   Screening recommendations/referrals: Colonoscopy: Done 06/18/18 Mammogram: Done 07/25/20 Bone Density: Done 05/25/20 Recommended yearly ophthalmology/optometry visit for glaucoma screening and checkup Recommended yearly dental visit for hygiene and checkup  Vaccinations: Influenza vaccine: Up to date Pneumococcal vaccine: Up to date Tdap vaccine: Due and discussed Shingles vaccine: Shingrix discussed. Please contact your pharmacy for coverage information.    Covid-19:Completed 2/6 & 02/04/20  Advanced directives: Please bring a copy of your health care power of attorney and living will to the office at your convenience.  Conditions/risks identified: Lose weight and continue to stay healthy  Next appointment: Follow up in one year for your annual wellness visit    Preventive Care 65 Years and Older, Female Preventive care refers to lifestyle choices and visits with your health care provider that can promote health and wellness. What does preventive care include?  A yearly physical exam. This is also called an annual well check.  Dental exams once or twice a year.  Routine eye exams. Ask your health care provider how often you should have your eyes checked.  Personal lifestyle choices, including:  Daily care of your teeth and gums.  Regular physical activity.  Eating a healthy diet.  Avoiding tobacco and drug use.  Limiting alcohol use.  Practicing safe sex.  Taking low-dose aspirin every day.  Taking vitamin and mineral supplements as recommended by your health care provider. What happens during an annual well check? The services and screenings done by your health care provider during your annual well check will depend on your age,  overall health, lifestyle risk factors, and family history of disease. Counseling  Your health care provider may ask you questions about your:  Alcohol use.  Tobacco use.  Drug use.  Emotional well-being.  Home and relationship well-being.  Sexual activity.  Eating habits.  History of falls.  Memory and ability to understand (cognition).  Work and work Astronomer.  Reproductive health. Screening  You may have the following tests or measurements:  Height, weight, and BMI.  Blood pressure.  Lipid and cholesterol levels. These may be checked every 5 years, or more frequently if you are over 23 years old.  Skin check.  Lung cancer screening. You may have this screening every year starting at age 43 if you have a 30-pack-year history of smoking and currently smoke or have quit within the past 15 years.  Fecal occult blood test (FOBT) of the stool. You may have this test every year starting at age 88.  Flexible sigmoidoscopy or colonoscopy. You may have a sigmoidoscopy every 5 years or a colonoscopy every 10 years starting at age 26.  Hepatitis C blood test.  Hepatitis B blood test.  Sexually transmitted disease (STD) testing.  Diabetes screening. This is done by checking your blood sugar (glucose) after you have not eaten for a while (fasting). You may have this done every 1-3 years.  Bone density scan. This is done to screen for osteoporosis. You may have this done starting at age 22.  Mammogram. This may be done every 1-2 years. Talk to your health care provider about how often you should have regular mammograms. Talk with your health care provider about your test results, treatment options, and if necessary, the need for  more tests. Vaccines  Your health care provider may recommend certain vaccines, such as:  Influenza vaccine. This is recommended every year.  Tetanus, diphtheria, and acellular pertussis (Tdap, Td) vaccine. You may need a Td booster every 10  years.  Zoster vaccine. You may need this after age 34.  Pneumococcal 13-valent conjugate (PCV13) vaccine. One dose is recommended after age 1.  Pneumococcal polysaccharide (PPSV23) vaccine. One dose is recommended after age 11. Talk to your health care provider about which screenings and vaccines you need and how often you need them. This information is not intended to replace advice given to you by your health care provider. Make sure you discuss any questions you have with your health care provider. Document Released: 12/21/2015 Document Revised: 08/13/2016 Document Reviewed: 09/25/2015 Elsevier Interactive Patient Education  2017 [employer] Prevention in the Home Falls can cause injuries. They can happen to people of all ages. There are many things you can do to make your home safe and to help prevent falls. What can I do on the outside of my home?  Regularly fix the edges of walkways and driveways and fix any cracks.  Remove anything that might make you trip as you walk through a door, such as a raised step or threshold.  Trim any bushes or trees on the path to your home.  Use bright outdoor lighting.  Clear any walking paths of anything that might make someone trip, such as rocks or tools.  Regularly check to see if handrails are loose or broken. Make sure that both sides of any steps have handrails.  Any raised decks and porches should have guardrails on the edges.  Have any leaves, snow, or ice cleared regularly.  Use sand or salt on walking paths during winter.  Clean up any spills in your garage right away. This includes oil or grease spills. What can I do in the bathroom?  Use night lights.  Install grab bars by the toilet and in the tub and shower. Do not use towel bars as grab bars.  Use non-skid mats or decals in the tub or shower.  If you need to sit down in the shower, use a plastic, non-slip stool.  Keep the floor dry. Clean up any water that  spills on the floor as soon as it happens.  Remove soap buildup in the tub or shower regularly.  Attach bath mats securely with double-sided non-slip rug tape.  Do not have throw rugs and other things on the floor that can make you trip. What can I do in the bedroom?  Use night lights.  Make sure that you have a light by your bed that is easy to reach.  Do not use any sheets or blankets that are too big for your bed. They should not hang down onto the floor.  Have a firm chair that has side arms. You can use this for support while you get dressed.  Do not have throw rugs and other things on the floor that can make you trip. What can I do in the kitchen?  Clean up any spills right away.  Avoid walking on wet floors.  Keep items that you use a lot in easy-to-reach places.  If you need to reach something above you, use a strong step stool that has a grab bar.  Keep electrical cords out of the way.  Do not use floor polish or wax that makes floors slippery. If you must use wax, use non-skid  floor wax.  Do not have throw rugs and other things on the floor that can make you trip. What can I do with my stairs?  Do not leave any items on the stairs.  Make sure that there are handrails on both sides of the stairs and use them. Fix handrails that are broken or loose. Make sure that handrails are as long as the stairways.  Check any carpeting to make sure that it is firmly attached to the stairs. Fix any carpet that is loose or worn.  Avoid having throw rugs at the top or bottom of the stairs. If you do have throw rugs, attach them to the floor with carpet tape.  Make sure that you have a light switch at the top of the stairs and the bottom of the stairs. If you do not have them, ask someone to add them for you. What else can I do to help prevent falls?  Wear shoes that:  Do not have high heels.  Have rubber bottoms.  Are comfortable and fit you well.  Are closed at the  toe. Do not wear sandals.  If you use a stepladder:  Make sure that it is fully opened. Do not climb a closed stepladder.  Make sure that both sides of the stepladder are locked into place.  Ask someone to hold it for you, if possible.  Clearly mark and make sure that you can see:  Any grab bars or handrails.  First and last steps.  Where the edge of each step is.  Use tools that help you move around (mobility aids) if they are needed. These include:  Canes.  Walkers.  Scooters.  Crutches.  Turn on the lights when you go into a dark area. Replace any light bulbs as soon as they burn out.  Set up your furniture so you have a clear path. Avoid moving your furniture around.  If any of your floors are uneven, fix them.  If there are any pets around you, be aware of where they are.  Review your medicines with your doctor. Some medicines can make you feel dizzy. This can increase your chance of falling. Ask your doctor what other things that you can do to help prevent falls. This information is not intended to replace advice given to you by your health care provider. Make sure you discuss any questions you have with your health care provider. Document Released: 09/20/2009 Document Revised: 05/01/2016 Document Reviewed: 12/29/2014 Elsevier Interactive Patient Education  2017 [employer].

## 2021-05-05 ENCOUNTER — Encounter: Payer: Self-pay | Admitting: Family Medicine

## 2021-05-16 DIAGNOSIS — D225 Melanocytic nevi of trunk: Secondary | ICD-10-CM | POA: Diagnosis not present

## 2021-05-16 DIAGNOSIS — D220 Melanocytic nevi of lip: Secondary | ICD-10-CM | POA: Diagnosis not present

## 2021-05-16 DIAGNOSIS — L814 Other melanin hyperpigmentation: Secondary | ICD-10-CM | POA: Diagnosis not present

## 2021-05-16 DIAGNOSIS — D2271 Melanocytic nevi of right lower limb, including hip: Secondary | ICD-10-CM | POA: Diagnosis not present

## 2021-05-16 DIAGNOSIS — D1801 Hemangioma of skin and subcutaneous tissue: Secondary | ICD-10-CM | POA: Diagnosis not present

## 2021-05-16 DIAGNOSIS — L281 Prurigo nodularis: Secondary | ICD-10-CM | POA: Diagnosis not present

## 2021-05-16 DIAGNOSIS — D2262 Melanocytic nevi of left upper limb, including shoulder: Secondary | ICD-10-CM | POA: Diagnosis not present

## 2021-05-16 DIAGNOSIS — D2372 Other benign neoplasm of skin of left lower limb, including hip: Secondary | ICD-10-CM | POA: Diagnosis not present

## 2021-05-16 DIAGNOSIS — D2261 Melanocytic nevi of right upper limb, including shoulder: Secondary | ICD-10-CM | POA: Diagnosis not present

## 2021-06-20 ENCOUNTER — Other Ambulatory Visit: Payer: Self-pay | Admitting: Family Medicine

## 2021-06-20 ENCOUNTER — Encounter: Payer: Self-pay | Admitting: Family Medicine

## 2021-06-20 DIAGNOSIS — Z1231 Encounter for screening mammogram for malignant neoplasm of breast: Secondary | ICD-10-CM

## 2021-08-12 ENCOUNTER — Other Ambulatory Visit: Payer: Self-pay | Admitting: Family Medicine

## 2021-08-15 ENCOUNTER — Other Ambulatory Visit: Payer: Self-pay

## 2021-08-15 ENCOUNTER — Ambulatory Visit
Admission: RE | Admit: 2021-08-15 | Discharge: 2021-08-15 | Disposition: A | Discharge status: Home / Self Care | Payer: Medicare PPO | Source: Ambulatory Visit | Attending: Family Medicine | Admitting: Family Medicine

## 2021-08-15 DIAGNOSIS — Z1231 Encounter for screening mammogram for malignant neoplasm of breast: Secondary | ICD-10-CM

## 2021-09-06 ENCOUNTER — Encounter: Payer: Self-pay | Admitting: Family Medicine

## 2021-09-10 ENCOUNTER — Ambulatory Visit (INDEPENDENT_AMBULATORY_CARE_PROVIDER_SITE_OTHER)
Admission: RE | Admit: 2021-09-10 | Discharge: 2021-09-10 | Disposition: A | Discharge status: Home / Self Care | Payer: Medicare PPO | Source: Ambulatory Visit | Attending: Family Medicine | Admitting: Family Medicine

## 2021-09-10 ENCOUNTER — Ambulatory Visit: Payer: Medicare PPO | Admitting: Family Medicine

## 2021-09-10 ENCOUNTER — Other Ambulatory Visit: Payer: Self-pay

## 2021-09-10 ENCOUNTER — Encounter: Payer: Self-pay | Admitting: Family Medicine

## 2021-09-10 VITALS — BP 136/84 | HR 82 | Temp 97.6°F | Ht 66.0 in | Wt 188.8 lb

## 2021-09-10 DIAGNOSIS — M25561 Pain in right knee: Secondary | ICD-10-CM | POA: Diagnosis not present

## 2021-09-10 DIAGNOSIS — Z23 Encounter for immunization: Secondary | ICD-10-CM

## 2021-09-10 DIAGNOSIS — S8991XA Unspecified injury of right lower leg, initial encounter: Secondary | ICD-10-CM

## 2021-09-10 DIAGNOSIS — M7989 Other specified soft tissue disorders: Secondary | ICD-10-CM | POA: Diagnosis not present

## 2021-09-10 DIAGNOSIS — M1711 Unilateral primary osteoarthritis, right knee: Secondary | ICD-10-CM | POA: Diagnosis not present

## 2021-09-10 NOTE — Progress Notes (Signed)
Subjective  CC:  Chief Complaint  Patient presents with   Knee Pain    Right knee, going on for a month    HPI: Karina Ingram is a 70 y.o. female who presents to the office today to address the problems listed above in the chief complaint. DOI: approx 08/11/2021; tripped over her daughter's dog and landed directly on flexed right knee. Had mild swelling, bruising and swelling and mild limp for the next day or two, but then improved. However, still with significant pain if kneeling or applying pressure to the front of the knee. Still is exercising and active and denies pain with most activities. Able to do stairs w/o pain. No locking or giveway sxs. Has known DJD knees. Reviewed xrays from 2018. Hasn't taken any oral medications for pain.    Assessment  1. Injury of right knee, initial encounter   2. Arthritis of right knee      Plan  Right knee OA and pain after fall:  bruised patella but can r/o fracture w/ xray. Mild swelling- could have cartilage injury as well, however, seems to be healing well. OA flare is also in differential. Doubt ligament injury. Rec ice,voltaren gel and time. Await xray report. If not improved over time, rec reeva and possible steroid injection. Patient understands and agrees with care plan.    Follow up: as scheduled  10/23/2021  No orders of the defined types were placed in this encounter.  No orders of the defined types were placed in this encounter.     I reviewed the patients updated PMH, FH, and SocHx.    Patient Active Problem List   Diagnosis Date Noted   Adjustment reaction with anxiety and depression 10/24/2018    Priority: 1.   Family history of premature CAD 02/19/2016    Priority: 1.   Essential hypertension 12/27/2014    Priority: 1.   Obesity (BMI 30-39.9) 09/02/2011    Priority: 1.   Mixed hyperlipidemia 09/02/2011    Priority: 1.   Tricompartment osteoarthritis of knees, bilateral 10/18/2020    Priority: 2.   Hepatic  steatosis 10/24/2018    Priority: 2.   Osteopenia 09/02/2011    Priority: 2.   Vitamin D deficiency 12/27/2014    Priority: 3.   Rhinitis, allergic 12/27/2014    Priority: 3.   Ptosis of eyelid 12/27/2014    Priority: 3.   Current Meds  Medication Sig   amLODipine (NORVASC) 5 MG tablet Take 1 tablet (5 mg total) by mouth daily.   atorvastatin (LIPITOR) 10 MG tablet TAKE 1 TABLET BY MOUTH EVERY DAY   calcium-vitamin D (OSCAL WITH D) 500-200 MG-UNIT per tablet Take 2 tablets by mouth daily.   Cholecalciferol (VITAMIN D3) 25 MCG (1000 UT) CAPS Take 1 capsule (1,000 Units total) by mouth daily.   diclofenac Sodium (VOLTAREN) 1 % GEL Apply topically 4 (four) times daily.   fexofenadine (ALLEGRA) 30 MG tablet Take 30 mg by mouth daily. Reported on 02/19/2016   fluticasone (FLONASE) 50 MCG/ACT nasal spray SPRAY 2 SPRAYS INTO EACH NOSTRIL EVERY DAY   lisinopril-hydrochlorothiazide (ZESTORETIC) 20-12.5 MG tablet Take 1 tablet by mouth daily.   Multiple Vitamins-Minerals (MULTIVITAMIN WITH MINERALS) tablet Take 1 tablet by mouth daily.   Omega-3 Fatty Acids (FISH OIL) 1000 MG CPDR Take 1 capsule by mouth daily.   TART CHERRY PO Take by mouth.    Allergies: Patient is allergic to fosamax [alendronate sodium]. Family History: Patient family history includes COPD  in her father; Cancer in her mother; Heart disease in her father and mother; Hyperlipidemia in her brother; Hypertension in her brother, father, and mother; Other in her sister; Renal cancer in her brother; Stroke in her father. Social History:  Patient  reports that she has never smoked. She has never used smokeless tobacco. She reports current alcohol use. She reports that she does not use drugs.  Review of Systems: Constitutional: Negative for fever malaise or anorexia Cardiovascular: negative for chest pain Respiratory: negative for SOB or persistent cough Gastrointestinal: negative for abdominal pain  Objective  Vitals: BP  136/84   Pulse 82   Temp 97.6 F (36.4 C) (Temporal)   Ht 5\' 6"  (1.676 m)   Wt 188 lb 12.8 oz (85.6 kg)   SpO2 97%   BMI 30.47 kg/m  General: no acute distress , A&Ox3 Nl gait Right knee: mild swelling, mild warmth w/o erythema. FROM w/ crepitus. Tender patella, neg apprehension test, neg lachmans and mcmurrays. Can squat w/o pain.     Commons side effects, risks, benefits, and alternatives for medications and treatment plan prescribed today were discussed, and the patient expressed understanding of the given instructions. Patient is instructed to call or message via MyChart if he/she has any questions or concerns regarding our treatment plan. No barriers to understanding were identified. We discussed Red Flag symptoms and signs in detail. Patient expressed understanding regarding what to do in case of urgent or emergency type symptoms.  Medication list was reconciled, printed and provided to the patient in AVS. Patient instructions and summary information was reviewed with the patient as documented in the AVS. This note was prepared with assistance of Dragon voice recognition software. Occasional wrong-word or sound-a-like substitutions may have occurred due to the inherent limitations of voice recognition software  This visit occurred during the SARS-CoV-2 public health emergency.  Safety protocols were in place, including screening questions prior to the visit, additional usage of staff PPE, and extensive cleaning of exam room while observing appropriate contact time as indicated for disinfecting solutions.

## 2021-09-10 NOTE — Patient Instructions (Signed)
Please follow up as scheduled for your next visit with me: 10/23/2021   Please go to our xray department on [address] to get your xrays done. You can walk in M-F between 8:30am- noon or 1pm - 5pm. Tell them you are there for xrays ordered by me. They will send me the results, then I will let you know the results with instructions.   Address: [address] [facility].  The Xray department is located in the basement.   If you have any questions or concerns, please don't hesitate to send me a message via MyChart or call the office at [phone]. Thank you for visiting with Karina Ingram today! It's our pleasure caring for you.

## 2021-10-21 ENCOUNTER — Encounter: Payer: Self-pay | Admitting: Family Medicine

## 2021-10-21 ENCOUNTER — Other Ambulatory Visit: Payer: Self-pay

## 2021-10-21 ENCOUNTER — Ambulatory Visit: Payer: Medicare PPO | Admitting: Family Medicine

## 2021-10-21 VITALS — BP 124/80 | HR 79 | Temp 97.7°F | Ht 66.0 in | Wt 185.4 lb

## 2021-10-21 DIAGNOSIS — M170 Bilateral primary osteoarthritis of knee: Secondary | ICD-10-CM | POA: Diagnosis not present

## 2021-10-21 DIAGNOSIS — M19042 Primary osteoarthritis, left hand: Secondary | ICD-10-CM

## 2021-10-21 DIAGNOSIS — I10 Essential (primary) hypertension: Secondary | ICD-10-CM

## 2021-10-21 MED ORDER — LISINOPRIL-HYDROCHLOROTHIAZIDE 20-12.5 MG PO TABS: 1.0000 | ORAL_TABLET | Freq: Every day | ORAL | 3 refills | Status: DC

## 2021-10-21 MED ORDER — AMLODIPINE BESYLATE 5 MG PO TABS: 5.0000 mg | ORAL_TABLET | Freq: Every day | ORAL | 3 refills | Status: DC

## 2021-10-21 NOTE — Progress Notes (Signed)
Subjective  CC:  Chief Complaint  Patient presents with   Hypertension    HPI: Karina Ingram is a 70 y.o. female who presents to the office today to address the problems listed above in the chief complaint. Hypertension f/u: Control is good . Pt reports she is doing well. taking medications as instructed, no medication side effects noted, no TIAs, no chest pain on exertion, no dyspnea on exertion, no swelling of ankles. She denies adverse effects from his BP medications. Compliance with medication is good.  C/o hand pain, worse on left. 1st mcp and radial wrist: worse with writing or lifting weights. Wearing a support at the gym which helps. No injury or fall. No numbness or paresthesias. No weakness. Tylenol help. Topical voltaren does not help.  Knee pain: now 90-95% resolved. Reviewed last note after fall. DJD on xray.  Assessment  1. Essential hypertension   2. Tricompartment osteoarthritis of knees, bilateral   3. Primary osteoarthritis of left hand      Plan   Hypertension f/u: BP control is well controlled. Refilled amlodipine 5 and zestoretic 20/12.5 Hand OA: start bid tylenol.  Knees pain;contusion and OA flare resolved.   Education regarding management of these chronic disease states was given. Management strategies discussed on successive visits include dietary and exercise recommendations, goals of achieving and maintaining IBW, and lifestyle modifications aiming for adequate sleep and minimizing stressors.   Follow up: No follow-ups on file.  No orders of the defined types were placed in this encounter.  Meds ordered this encounter  Medications   amLODipine (NORVASC) 5 MG tablet    Sig: Take 1 tablet (5 mg total) by mouth daily.    Dispense:  90 tablet    Refill:  3    Please keep on file for next refill due January   lisinopril-hydrochlorothiazide (ZESTORETIC) 20-12.5 MG tablet    Sig: Take 1 tablet by mouth daily.    Dispense:  90 tablet    Refill:  3     Please keep on file for next refill due january      BP Readings from Last 3 Encounters:  10/21/21 124/80  09/10/21 136/84  04/18/21 130/78   Wt Readings from Last 3 Encounters:  10/21/21 185 lb 6.4 oz (84.1 kg)  09/10/21 188 lb 12.8 oz (85.6 kg)  04/18/21 192 lb (87.1 kg)    Lab Results  Component Value Date   CHOL 167 04/18/2021   CHOL 253 (H) 04/16/2020   CHOL 184 06/17/2019   Lab Results  Component Value Date   HDL 50.40 04/18/2021   HDL 43.80 04/16/2020   HDL 46 (L) 06/17/2019   Lab Results  Component Value Date   LDLCALC 104 (H) 06/17/2019   LDLCALC 77 02/26/2018   LDLCALC 80 02/19/2016   Lab Results  Component Value Date   TRIG 231.0 (H) 04/18/2021   TRIG 328.0 (H) 04/16/2020   TRIG 220 (H) 06/17/2019   Lab Results  Component Value Date   CHOLHDL 3 04/18/2021   CHOLHDL 6 04/16/2020   CHOLHDL 4.0 06/17/2019   Lab Results  Component Value Date   LDLDIRECT 72.0 04/18/2021   LDLDIRECT 122.0 04/16/2020   LDLDIRECT 72.0 02/23/2017   Lab Results  Component Value Date   CREATININE 0.89 04/18/2021   BUN 17 04/18/2021   NA 141 04/18/2021   K 3.8 04/18/2021   CL 101 04/18/2021   CO2 33 (H) 04/18/2021    The 10-year ASCVD risk score (  Arnett DK, et al., 2019) is: 9.2%   Values used to calculate the score:     Age: 70 years     Sex: Female     Is Non-Hispanic African American: No     Diabetic: No     Tobacco smoker: No     Systolic Blood Pressure: [id number] mmHg     Is BP treated: Yes     HDL Cholesterol: 50.4 mg/dL     Total Cholesterol: 167 mg/dL  I reviewed the patients updated PMH, FH, and SocHx.    Patient Active Problem List   Diagnosis Date Noted   Adjustment reaction with anxiety and depression 10/24/2018    Priority: High   Family history of premature CAD 02/19/2016    Priority: High   Essential hypertension 12/27/2014    Priority: High   Obesity (BMI 30-39.9) 09/02/2011    Priority: High   Mixed hyperlipidemia 09/02/2011    Priority:  High   Tricompartment osteoarthritis of knees, bilateral 10/18/2020    Priority: Medium    Hepatic steatosis 10/24/2018    Priority: Medium    Osteopenia 09/02/2011    Priority: Medium    Vitamin D deficiency 12/27/2014    Priority: Low   Rhinitis, allergic 12/27/2014    Priority: Low   Ptosis of eyelid 12/27/2014    Priority: Low    Allergies: Fosamax [alendronate sodium]  Social History: Patient  reports that she has never smoked. She has never used smokeless tobacco. She reports current alcohol use. She reports that she does not use drugs.  Current Meds  Medication Sig   atorvastatin (LIPITOR) 10 MG tablet TAKE 1 TABLET BY MOUTH EVERY DAY   calcium-vitamin D (OSCAL WITH D) 500-200 MG-UNIT per tablet Take 2 tablets by mouth daily.   Cholecalciferol (VITAMIN D3) 25 MCG (1000 UT) CAPS Take 1 capsule (1,000 Units total) by mouth daily.   diclofenac Sodium (VOLTAREN) 1 % GEL Apply topically 4 (four) times daily.   fexofenadine (ALLEGRA) 30 MG tablet Take 30 mg by mouth daily. Reported on 02/19/2016   fluticasone (FLONASE) 50 MCG/ACT nasal spray SPRAY 2 SPRAYS INTO EACH NOSTRIL EVERY DAY   Multiple Vitamins-Minerals (MULTIVITAMIN WITH MINERALS) tablet Take 1 tablet by mouth daily.   Omega-3 Fatty Acids (FISH OIL) 1000 MG CPDR Take 1 capsule by mouth daily.   TART CHERRY PO Take by mouth.   [DISCONTINUED] amLODipine (NORVASC) 5 MG tablet Take 1 tablet (5 mg total) by mouth daily.   [DISCONTINUED] lisinopril-hydrochlorothiazide (ZESTORETIC) 20-12.5 MG tablet Take 1 tablet by mouth daily.    Review of Systems: Cardiovascular: negative for chest pain, palpitations, leg swelling, orthopnea Respiratory: negative for SOB, wheezing or persistent cough Gastrointestinal: negative for abdominal pain Genitourinary: negative for dysuria or gross hematuria  Objective  Vitals: BP 124/80   Pulse 79   Temp 97.7 F (36.5 C) (Temporal)   Ht 5\' 6"  (1.676 m)   Wt 185 lb 6.4 oz (84.1 kg)   SpO2  97%   BMI 29.92 kg/m  General: no acute distress  Psych:  Alert and oriented, normal mood and affect HEENT:  Normocephalic, atraumatic, supple neck  Cardiovascular:  RRR without murmur. no edema Respiratory:  Good breath sounds bilaterally, CTAB with normal respiratory effort Left hand: 60mcp oa changes. Neg finklesteins. Commons side effects, risks, benefits, and alternatives for medications and treatment plan prescribed today were discussed, and the patient expressed understanding of the given instructions. Patient is instructed to call or message via MyChart if  he/she has any questions or concerns regarding our treatment plan. No barriers to understanding were identified. We discussed Red Flag symptoms and signs in detail. Patient expressed understanding regarding what to do in case of urgent or emergency type symptoms.  Medication list was reconciled, printed and provided to the patient in AVS. Patient instructions and summary information was reviewed with the patient as documented in the AVS. This note was prepared with assistance of Dragon voice recognition software. Occasional wrong-word or sound-a-like substitutions may have occurred due to the inherent limitations of voice recognition software  This visit occurred during the SARS-CoV-2 public health emergency.  Safety protocols were in place, including screening questions prior to the visit, additional usage of staff PPE, and extensive cleaning of exam room while observing appropriate contact time as indicated for disinfecting solutions.

## 2021-10-21 NOTE — Patient Instructions (Signed)
Please return in 6 months for your annual complete physical; please come fasting.   Glad you are well!  If you have any questions or concerns, please don't hesitate to send me a message via MyChart or call the office at [phone]. Thank you for visiting with us today! It's our pleasure caring for you.  

## 2021-10-23 ENCOUNTER — Ambulatory Visit: Payer: Medicare PPO | Admitting: Family Medicine

## 2022-03-24 ENCOUNTER — Other Ambulatory Visit: Payer: Self-pay | Admitting: Family Medicine

## 2022-03-24 DIAGNOSIS — J301 Allergic rhinitis due to pollen: Secondary | ICD-10-CM

## 2022-04-24 ENCOUNTER — Ambulatory Visit (INDEPENDENT_AMBULATORY_CARE_PROVIDER_SITE_OTHER): Payer: Medicare PPO | Admitting: Family Medicine

## 2022-04-24 ENCOUNTER — Encounter: Payer: Self-pay | Admitting: Family Medicine

## 2022-04-24 VITALS — BP 138/86 | HR 72 | Temp 98.7°F | Ht 66.0 in | Wt 186.8 lb

## 2022-04-24 DIAGNOSIS — E782 Mixed hyperlipidemia: Secondary | ICD-10-CM | POA: Diagnosis not present

## 2022-04-24 DIAGNOSIS — K760 Fatty (change of) liver, not elsewhere classified: Secondary | ICD-10-CM | POA: Diagnosis not present

## 2022-04-24 DIAGNOSIS — Z0000 Encounter for general adult medical examination without abnormal findings: Secondary | ICD-10-CM | POA: Diagnosis not present

## 2022-04-24 DIAGNOSIS — I10 Essential (primary) hypertension: Secondary | ICD-10-CM | POA: Diagnosis not present

## 2022-04-24 DIAGNOSIS — Z8249 Family history of ischemic heart disease and other diseases of the circulatory system: Secondary | ICD-10-CM | POA: Diagnosis not present

## 2022-04-24 DIAGNOSIS — L609 Nail disorder, unspecified: Secondary | ICD-10-CM

## 2022-04-24 DIAGNOSIS — E611 Iron deficiency: Secondary | ICD-10-CM | POA: Diagnosis not present

## 2022-04-24 DIAGNOSIS — L603 Nail dystrophy: Secondary | ICD-10-CM | POA: Diagnosis not present

## 2022-04-24 LAB — COMPREHENSIVE METABOLIC PANEL
ALT: 20 U/L (ref 0–35)
AST: 17 U/L (ref 0–37)
Albumin: 4.3 g/dL (ref 3.5–5.2)
Alkaline Phosphatase: 77 U/L (ref 39–117)
BUN: 15 mg/dL (ref 6–23)
CO2: 31 mEq/L (ref 19–32)
Calcium: 9.6 mg/dL (ref 8.4–10.5)
Chloride: 102 mEq/L (ref 96–112)
Creatinine, Ser: 0.87 mg/dL (ref 0.40–1.20)
GFR: 68.15 mL/min (ref >60.00)
Glucose, Bld: 100 mg/dL — ABNORMAL HIGH (ref 70–99)
Potassium: 4 mEq/L (ref 3.5–5.1)
Sodium: 140 mEq/L (ref 135–145)
Total Bilirubin: 0.7 mg/dL (ref 0.2–1.2)
Total Protein: 7 g/dL (ref 6.0–8.3)

## 2022-04-24 LAB — CBC WITH DIFFERENTIAL/PLATELET
Basophils Absolute: 0 10*3/uL (ref 0.0–0.1)
Basophils Relative: 0.7 % (ref 0.0–3.0)
Eosinophils Absolute: 0.1 10*3/uL (ref 0.0–0.7)
Eosinophils Relative: 1.8 % (ref 0.0–5.0)
HCT: 40.2 % (ref 36.0–46.0)
Hemoglobin: 13.8 g/dL (ref 12.0–15.0)
Lymphocytes Relative: 31.2 % (ref 12.0–46.0)
Lymphs Abs: 1.8 10*3/uL (ref 0.7–4.0)
MCHC: 34.4 g/dL (ref 30.0–36.0)
MCV: 89.6 fl (ref 78.0–100.0)
Monocytes Absolute: 0.4 10*3/uL (ref 0.1–1.0)
Monocytes Relative: 7.7 % (ref 3.0–12.0)
Neutro Abs: 3.4 10*3/uL (ref 1.4–7.7)
Neutrophils Relative %: 58.6 % (ref 43.0–77.0)
Platelets: 300 10*3/uL (ref 150.0–400.0)
RBC: 4.49 Mil/uL (ref 3.87–5.11)
RDW: 13.2 % (ref 11.5–15.5)
WBC: 5.7 10*3/uL (ref 4.0–10.5)

## 2022-04-24 LAB — TSH: TSH: 2.18 u[IU]/mL (ref 0.35–5.50)

## 2022-04-24 LAB — LIPID PANEL
Cholesterol: 156 mg/dL (ref 0–200)
HDL: 53.3 mg/dL (ref >39.00)
LDL Cholesterol: 71 mg/dL (ref 0–99)
NonHDL: 103.15
Total CHOL/HDL Ratio: 3
Triglycerides: 161 mg/dL — ABNORMAL HIGH (ref 0.0–149.0)
VLDL: 32.2 mg/dL (ref 0.0–40.0)

## 2022-04-24 LAB — B12 AND FOLATE PANEL
Folate: >23.5 ng/mL (ref >5.9)
Vitamin B-12: 469 pg/mL (ref 211–911)

## 2022-04-24 NOTE — Patient Instructions (Signed)
Please return in 12 months for your annual complete physical; please come fasting.   I will release your lab results to you on your MyChart account with further instructions. You may see the results before I do, but when I review them I will send you a message with my report or have my assistant call you if things need to be discussed. Please reply to my message with any questions. Thank you!   If you have any questions or concerns, please don't hesitate to send me a message via MyChart or call the office at [phone]. Thank you for visiting with us today! It's our pleasure caring for you.   Preventive Care 65 Years and Older, Female Preventive care refers to lifestyle choices and visits with your health care provider that can promote health and wellness. Preventive care visits are also called wellness exams. What can I expect for my preventive care visit? Counseling Your health care provider may ask you questions about your: Medical history, including: Past medical problems. Family medical history. Pregnancy and menstrual history. History of falls. Current health, including: Memory and ability to understand (cognition). Emotional well-being. Home life and relationship well-being. Sexual activity and sexual health. Lifestyle, including: Alcohol, nicotine or tobacco, and drug use. Access to firearms. Diet, exercise, and sleep habits. Work and work environment. Sunscreen use. Safety issues such as seatbelt and bike helmet use. Physical exam Your health care provider will check your: Height and weight. These may be used to calculate your BMI (body mass index). BMI is a measurement that tells if you are at a healthy weight. Waist circumference. This measures the distance around your waistline. This measurement also tells if you are at a healthy weight and may help predict your risk of certain diseases, such as type 2 diabetes and high blood pressure. Heart rate and blood  pressure. Body temperature. Skin for abnormal spots. What immunizations do I need?  Vaccines are usually given at various ages, according to a schedule. Your health care provider will recommend vaccines for you based on your age, medical history, and lifestyle or other factors, such as travel or where you work. What tests do I need? Screening Your health care provider may recommend screening tests for certain conditions. This may include: Lipid and cholesterol levels. Hepatitis C test. Hepatitis B test. HIV (human immunodeficiency virus) test. STI (sexually transmitted infection) testing, if you are at risk. Lung cancer screening. Colorectal cancer screening. Diabetes screening. This is done by checking your blood sugar (glucose) after you have not eaten for a while (fasting). Mammogram. Talk with your health care provider about how often you should have regular mammograms. BRCA-related cancer screening. This may be done if you have a family history of breast, ovarian, tubal, or peritoneal cancers. Bone density scan. This is done to screen for osteoporosis. Talk with your health care provider about your test results, treatment options, and if necessary, the need for more tests. Follow these instructions at home: Eating and drinking  Eat a diet that includes fresh fruits and vegetables, whole grains, lean protein, and low-fat dairy products. Limit your intake of foods with high amounts of sugar, saturated fats, and salt. Take vitamin and mineral supplements as recommended by your health care provider. Do not drink alcohol if your health care provider tells you not to drink. If you drink alcohol: Limit how much you have to 0-1 drink a day. Know how much alcohol is in your drink. In the [place] one drink equals one 12 oz   bottle of beer (355 mL), one 5 oz glass of wine (148 mL), or one 1 oz glass of hard liquor (44 mL). Lifestyle Brush your teeth every morning and night with fluoride  toothpaste. Floss one time each day. Exercise for at least 30 minutes 5 or more days each week. Do not use any products that contain nicotine or tobacco. These products include cigarettes, chewing tobacco, and vaping devices, such as e-cigarettes. If you need help quitting, ask your health care provider. Do not use drugs. If you are sexually active, practice safe sex. Use a condom or other form of protection in order to prevent STIs. Take aspirin only as told by your health care provider. Make sure that you understand how much to take and what form to take. Work with your health care provider to find out whether it is safe and beneficial for you to take aspirin daily. Ask your health care provider if you need to take a cholesterol-lowering medicine (statin). Find healthy ways to manage stress, such as: Meditation, yoga, or listening to music. Journaling. Talking to a trusted person. Spending time with friends and family. Minimize exposure to UV radiation to reduce your risk of skin cancer. Safety Always wear your seat belt while driving or riding in a vehicle. Do not drive: If you have been drinking alcohol. Do not ride with someone who has been drinking. When you are tired or distracted. While texting. If you have been using any mind-altering substances or drugs. Wear a helmet and other protective equipment during sports activities. If you have firearms in your house, make sure you follow all gun safety procedures. What's next? Visit your health care provider once a year for an annual wellness visit. Ask your health care provider how often you should have your eyes and teeth checked. Stay up to date on all vaccines. This information is not intended to replace advice given to you by your health care provider. Make sure you discuss any questions you have with your health care provider. Document Revised: 05/22/2021 Document Reviewed: 05/22/2021 Elsevier Patient Education  2023 Else[employer]  Inc.  

## 2022-04-24 NOTE — Progress Notes (Signed)
Subjective  Chief Complaint  Patient presents with   Annual Exam    Pt here for Annual Exam and is currently fasting    HPI: Karina Ingram is a 71 y.o. female who presents to [facility] today for a Female Wellness Visit. She also has the concerns and/or needs as listed above in the chief complaint. These will be addressed in addition to the Health Maintenance Visit.   Wellness Visit: annual visit with health maintenance review and exam without Pap  Health maintenance: Screens are current.  Overall doing well.  Works out at [employer].  Happy. Chronic disease f/u and/or acute problem visit: (deemed necessary to be done in addition to the wellness visit): Hypertension: Feeling well. Taking medications w/o adverse effects. No symptoms of CHF, angina; no palpitations, sob, cp or lower extremity edema. Compliant with meds.  HLD on lipitor 10 and tolerates well. Fasting for recheck.  He has noticed that her nails have been breaking/splintering intermittently over the last several weeks and she has noticed some ridging.  Uncertain if there is a reason for this.  No other skin changes.  Assessment  1. Annual physical exam   2. Mixed hyperlipidemia   3. Essential hypertension   4. Family history of premature CAD   5. Nail fragility   6. Ridged nails   7. Hepatic steatosis      Plan  Female Wellness Visit: Age appropriate Health Maintenance and Prevention measures were discussed with patient. Included topics are cancer screening recommendations, ways to keep healthy (see AVS) including dietary and exercise recommendations, regular eye and dental care, use of seat belts, and avoidance of moderate alcohol use and tobacco use.  Screens are current BMI: discussed patient's BMI and encouraged positive lifestyle modifications to help get to or maintain a target BMI. HM needs and immunizations were addressed and ordered. See below for orders. See HM and immunization  section for updates. Routine labs and screening tests ordered including cmp, cbc and lipids where appropriate. Discussed recommendations regarding Vit D and calcium supplementation (see AVS)  Chronic disease management visit and/or acute problem visit: Hypertension remains well controlled on amlodipine 5 and Zestoretic 20/12.5 daily.  Recheck electrolytes.  Check renal function.  Continue low-salt diet and weight loss Hyperlipidemia Lipitor 10 nightly.  Tolerating well.  History of hepatic steatosis.  Check fasting lipids and LFTs.  Continue low-fat diet Nail changes: Rule out thyroid and vitamin deficiencies and iron-deficiency anemia.  She is on biotin for the last 2 weeks.  Follow up: 12 months for complete physical Orders Placed This Encounter  Procedures   CBC with Differential/Platelet   Comp Met (CMET)   Lipid Profile   B12 and Folate Panel   TSH   Iron, TIBC and Ferritin Panel   No orders of the defined types were placed in this encounter.     Body mass index is 30.15 kg/m. Wt Readings from Last 3 Encounters:  04/24/22 186 lb 12.8 oz (84.7 kg)  10/21/21 185 lb 6.4 oz (84.1 kg)  09/10/21 188 lb 12.8 oz (85.6 kg)     Patient Active Problem List   Diagnosis Date Noted   Family history of premature CAD 02/19/2016    Priority: High   Essential hypertension 12/27/2014    Priority: High   Obesity (BMI 30-39.9) 09/02/2011    Priority: High   Mixed hyperlipidemia 09/02/2011    Priority: High   Tricompartment osteoarthritis of knees, bilateral 10/18/2020  Priority: Medium    Hepatic steatosis 10/24/2018    Priority: Medium     abd [place] 2020     Osteopenia 09/02/2011    Priority: Medium     03/01/18 The BMD measured at Femur Neck Right is 0.825 g/cm2 with a T-score of -1.5. This patient is considered osteopenic according to [facility]) criteria. 06/2020: T = -1.5, stable osteopenia. Recheck 3 years.     Vitamin D deficiency 12/27/2014    Priority:  Low   Rhinitis, allergic 12/27/2014    Priority: Low   Ptosis of eyelid 12/27/2014    Priority: [place] Maintenance  Topic Date Due   COVID-19 Vaccine (5 - Booster for Pfizer series) 08/19/2021   INFLUENZA VACCINE  07/08/2022   MAMMOGRAM  08/15/2022   DEXA SCAN  05/26/2023   COLONOSCOPY (Pts 45-55yr Insurance coverage will need to be confirmed)  06/18/2028   TETANUS/TDAP  06/18/2031   Pneumonia Vaccine 71 Years old  Completed   Hepatitis C Screening  Completed   Zoster Vaccines- Shingrix  Completed   HPV VACCINES  Aged Out   Immunization History  Administered Date(s) Administered   Fluad Quad(high Dose 65+) 09/10/2021   Hepatitis B 10/08/2009, 11/07/2009, 07/17/2010   Influenza Split 09/02/2011, 09/14/2012, 09/25/2015   Influenza Whole 08/16/2010, 10/05/2018   Influenza, High Dose Seasonal PF 10/05/2018, 08/29/2019, 08/28/2020   Influenza-Unspecified 09/07/2016   PFIZER Comirnaty(Gray Top)Covid-19 Tri-Sucrose Vaccine 06/24/2021   PFIZER(Purple Top)SARS-COV-2 Vaccination 01/14/2020, 02/04/2020, 09/11/2020   Pneumococcal Conjugate-13 07/14/2020   Pneumococcal Polysaccharide-23 10/25/2018, 07/06/2019   Tdap 08/16/2010, 06/17/2021   Zoster Recombinat (Shingrix) 07/25/2020, 10/16/2020   Zoster, Live 12/12/2013   We updated and reviewed the patient's past history in detail and it is documented below. Allergies: Patient is allergic to fosamax [alendronate sodium]. 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 Past Surgical History Patient  has a past surgical history that includes Tonsillectomy; Wisdom tooth extraction; Colonoscopy (2019); and Inguinal hernia repair (1980). Family History: Patient family history includes  COPD in her father; Cancer in her mother; Heart disease in her father and mother; Hyperlipidemia in her brother; Hypertension in her brother, father, and mother; Other in her sister; Renal cancer in her brother; Stroke in her father. Social History:  Patient  reports that she has never smoked. She has never used smokeless tobacco. She reports current alcohol use. She reports that she does not use drugs.  Review of Systems: Constitutional: negative for fever or malaise Ophthalmic: negative for photophobia, double vision or loss of vision Cardiovascular: negative for chest pain, dyspnea on exertion, or new LE swelling Respiratory: negative for SOB or persistent cough Gastrointestinal: negative for abdominal pain, change in bowel habits or melena Genitourinary: negative for dysuria or gross hematuria, no abnormal uterine bleeding or disharge Musculoskeletal: negative for new gait disturbance or muscular weakness Integumentary: negative for new or persistent rashes, no breast lumps Neurological: negative for TIA or stroke symptoms Psychiatric: negative for SI or delusions Allergic/Immunologic: negative for hives  Patient Care Team    Relationship Specialty Notifications Start End  ALeamon Arnt MD PCP - General Family Medicine  12/22/19   SSydnee Levans MD Consulting Physician Dermatology  04/16/20     Objective  Vitals: BP 138/86   Pulse 72   Temp 98.7 F (  37.1 C)   Ht 5' 6"  (1.676 m)   Wt 186 lb 12.8 oz (84.7 kg)   SpO2 97%   BMI 30.15 kg/m  General:  Well developed, well nourished, no acute distress  Psych:  Alert and orientedx3,normal mood and affect HEENT:  Normocephalic, atraumatic, non-icteric sclera,  supple neck without adenopathy, mass or thyromegaly Cardiovascular:  Normal S1, S2, RRR without gallop, rub or murmur Respiratory:  Good breath sounds bilaterally, CTAB with normal respiratory effort Gastrointestinal: normal bowel sounds, soft, non-tender, no noted  masses. No HSM MSK: no deformities, contusions. Joints are without erythema or swelling.  Skin:  Warm, no rashes or suspicious lesions noted, nails with some splinter hemorrhage and ridging. Neurologic:    Mental status is normal. CN 2-11 are normal. Gross motor and sensory exams are normal. Normal gait. No tremor   Commons side effects, risks, benefits, and alternatives for medications and treatment plan prescribed today were discussed, and the patient expressed understanding of the given instructions. Patient is instructed to call or message via MyChart if he/she has any questions or concerns regarding our treatment plan. No barriers to understanding were identified. We discussed Red Flag symptoms and signs in detail. Patient expressed understanding regarding what to do in case of urgent or emergency type symptoms.  Medication list was reconciled, printed and provided to the patient in AVS. Patient instructions and summary information was reviewed with the patient as documented in the AVS. This note was prepared with assistance of Dragon voice recognition software. Occasional wrong-word or sound-a-like substitutions may have occurred due to the inherent limitations of voice recognition software  This visit occurred during the SARS-CoV-2 public health emergency.  Safety protocols were in place, including screening questions prior to the visit, additional usage of staff PPE, and extensive cleaning of exam room while observing appropriate contact time as indicated for disinfecting solutions.

## 2022-04-25 ENCOUNTER — Ambulatory Visit (INDEPENDENT_AMBULATORY_CARE_PROVIDER_SITE_OTHER): Payer: Medicare PPO

## 2022-04-25 VITALS — BP 122/84 | HR 78 | Temp 98.7°F | Wt 190.2 lb

## 2022-04-25 DIAGNOSIS — Z0000 Encounter for general adult medical examination without abnormal findings: Secondary | ICD-10-CM

## 2022-04-25 LAB — IRON,TIBC AND FERRITIN PANEL
%SAT: 22 % (calc) (ref 16–45)
Ferritin: 12 ng/mL — ABNORMAL LOW (ref 16–288)
Iron: 87 ug/dL (ref 45–160)
TIBC: 394 mcg/dL (calc) (ref 250–450)

## 2022-04-25 NOTE — Progress Notes (Addendum)
Subjective:   Karina Ingram is a 71 y.o. female who presents for Medicare Annual (Subsequent) preventive examination.  Review of Systems     Cardiac Risk Factors include: advanced age (>40men, >29 women);dyslipidemia;hypertension;obesity (BMI >30kg/m2)     Objective:    Today's Vitals   04/25/22 0749  BP: 122/84  Pulse: 78  Temp: 98.7 F (37.1 C)  SpO2: 98%  Weight: 190 lb 3.2 oz (86.3 kg)   Body mass index is 30.7 kg/m.     04/25/2022    8:00 AM 04/18/2021    8:05 AM 04/16/2020    9:25 AM 06/17/2019    2:18 PM  Advanced Directives  Does Patient Have a Medical Advance Directive? Yes Yes Yes No  Type of Advance Directive Healthcare Power of Attorney Living will Healthcare Power of Attorney   Does patient want to make changes to medical advance directive?   No - Patient declined   Copy of Healthcare Power of Attorney in Chart? No - copy requested  No - copy requested   Would patient like information on creating a medical advance directive?    No - Patient declined    Current Medications (verified) Outpatient Encounter Medications as of 04/25/2022  Medication Sig   amLODipine (NORVASC) 5 MG tablet Take 1 tablet (5 mg total) by mouth daily.   atorvastatin (LIPITOR) 10 MG tablet TAKE 1 TABLET BY MOUTH EVERY DAY   calcium-vitamin D (OSCAL WITH D) 500-200 MG-UNIT per tablet Take 2 tablets by mouth daily.   Cholecalciferol (VITAMIN D3) 25 MCG (1000 UT) CAPS Take 1 capsule (1,000 Units total) by mouth daily.   diclofenac Sodium (VOLTAREN) 1 % GEL Apply topically 4 (four) times daily.   fexofenadine (ALLEGRA) 30 MG tablet Take 30 mg by mouth daily. Reported on 02/19/2016   fluticasone (FLONASE) 50 MCG/ACT nasal spray SPRAY 2 SPRAYS INTO EACH NOSTRIL EVERY DAY   lisinopril-hydrochlorothiazide (ZESTORETIC) 20-12.5 MG tablet Take 1 tablet by mouth daily.   Multiple Vitamins-Minerals (MULTIVITAMIN WITH MINERALS) tablet Take 1 tablet by mouth daily.   Omega-3 Fatty Acids (FISH OIL)  1000 MG CPDR Take 1 capsule by mouth daily.   TART CHERRY PO Take by mouth.   No facility-administered encounter medications on file as of 04/25/2022.    Allergies (verified) Fosamax [alendronate sodium]   History: Past Medical History:  Diagnosis Date   Adjustment reaction with anxiety and depression 10/24/2018   Treated with wellbutrin and lexapro; weaned off successfully 2021.   Allergic rhinitis    Arthritis    Depression    Dyslipidemia    Family history of premature CAD    Hypertension    Osteopenia, DEXA 2013, 2016    Positive PPD, treated    Vitamin D deficiency    Past Surgical History:  Procedure Laterality Date   COLONOSCOPY  2019   INGUINAL HERNIA REPAIR  1980   TONSILLECTOMY     WISDOM TOOTH EXTRACTION     Family History  Problem Relation Age of Onset   Cancer Mother    Heart disease Mother    Hypertension Mother    Stroke Father    Hypertension Father    Heart disease Father    COPD Father    Other Sister    Hypertension Brother    Hyperlipidemia Brother    Renal cancer Brother    Social History   Socioeconomic History   Marital status: Married    Spouse name: Not on file   Number of children:  Not on file   Years of education: Not on file   Highest education level: Not on file  Occupational History    Employer: [employer]  Tobacco Use   Smoking status: Never   Smokeless tobacco: Never  Substance and Sexual Activity   Alcohol use: Yes    Comment: 2-3 per month   Drug use: No   Sexual activity: Yes    Birth control/protection: Post-menopausal  Other Topics Concern   Not on file  Social History Narrative   Married, [employer], does weight bearing exercise 3 days per week, cardio, uses a trainer/goes to exercise class, former Runner, broadcasting/film/video, 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  Volunteer at [employer] on Tuesdays, involved in church, sees friends regularly.     Hobbies: [employer],  reading, knitting, mobile meals    Social Determinants of Health   Financial Resource Strain: Low Risk    Difficulty of Paying Living Expenses: Not hard at all  Food Insecurity: No Food Insecurity   Worried About Programme researcher, broadcasting/film/video in the Last Year: Never true   Barista in the Last Year: Never true  Transportation Needs: No Transportation Needs   Lack of Transportation (Medical): No   Lack of Transportation (Non-Medical): No  Physical Activity: Insufficiently Active   Days of Exercise per Week: 5 days   Minutes of Exercise per Session: 20 min  Stress: No Stress Concern Present   Feeling of Stress : Only a little  Social Connections: Press photographer of Communication with Friends and Family: More than three times a week   Frequency of Social Gatherings with Friends and Family: More than three times a week   Attends Religious Services: More than 4 times per year   Active Member of [employer] or Organizations: Yes   Attends Banker Meetings: 1 to 4 times per year   Marital Status: Married    Tobacco Counseling Counseling given: Not Answered   Clinical Intake:  Pre-visit preparation completed: Yes  Pain : No/denies pain     BMI - recorded: 30.7 Nutritional Status: BMI > 30  Obese Nutritional Risks: None Diabetes: No  How often do you need to have someone help you when you read instructions, pamphlets, or other written materials from your doctor or pharmacy?: 1 - Never  Diabetic?no  Interpreter Needed?: No  Information entered by :: Lanier Ensign, LPN   Activities of Daily Living    04/25/2022    8:02 AM  In your present state of health, do you have any difficulty performing the following activities:  Hearing? 1  Comment slight loss in crowds  Vision? 0  Difficulty concentrating or making decisions? 0  Walking or climbing stairs? 0  Dressing or bathing? 0  Doing errands, shopping? 0  Preparing Food and eating ? N  Using the  Toilet? N  In the past six months, have you accidently leaked urine? N  Do you have problems with loss of bowel control? N  Managing your Medications? N  Managing your Finances? N  Housekeeping or managing your Housekeeping? N    Patient Care Team: Willow Ora, MD as PCP - General (Family Medicine) Bufford Buttner, MD as Consulting Physician (Dermatology)  Indicate any recent Medical Services you may have received from other than [facility] providers in the past year (date may be approximate).     Assessment:   This is a routine wellness examination for  Kynli.  Hearing/Vision screen Hearing Screening - Comments:: Pt stated slight loss in crowds  Vision Screening - Comments:: Pt follows up with my eye Dr for annual eye exams   Dietary issues and exercise activities discussed: Current Exercise Habits: Home exercise routine, Type of exercise: walking, Time (Minutes): 20, Frequency (Times/Week): 5, Weekly Exercise (Minutes/Week): 100   Goals Addressed             This Visit's Progress    Patient Stated       Maintain good health and stronger each year       Depression Screen    04/25/2022    7:57 AM 10/21/2021    2:17 PM 09/10/2021    8:25 AM 04/18/2021    9:02 AM 04/18/2021    8:03 AM 04/16/2020    8:42 AM 06/17/2019    2:28 PM  PHQ 2/9 Scores  PHQ - 2 Score 1 0 0 0 1 0 0  PHQ- 9 Score  0  0   0    Fall Risk    04/25/2022    8:01 AM 04/24/2022    8:50 AM 04/18/2021    8:07 AM 04/16/2020    8:42 AM 12/22/2019    9:13 AM  Fall Risk   Falls in the past year? 0 1  Number falls in past yr: 1 0 1 0 0  Injury with Fall? 0 0 0 0 0  Risk for fall due to : Impaired vision;Impaired balance/gait History of fall(s) Impaired vision    Risk for fall due to: Comment at times      Follow up Falls prevention discussed Falls evaluation completed Falls prevention discussed Falls evaluation completed;Education provided;Falls prevention discussed Falls evaluation completed     FALL RISK PREVENTION PERTAINING TO THE HOME:  Any stairs in or around the home? No  If so, are there any without handrails? No  Home free of loose throw rugs in walkways, pet beds, electrical cords, etc? Yes  Adequate lighting in your home to reduce risk of falls? Yes   ASSISTIVE DEVICES UTILIZED TO PREVENT FALLS:  Life alert? No  Use of a cane, walker or w/c? No  Grab bars in the bathroom? No  Shower chair or bench in shower? No  Elevated toilet seat or a handicapped toilet? No   TIMED UP AND GO:  Was the test performed? Yes .  Length of time to ambulate 10 feet: 10 sec.   Gait steady and fast without use of assistive device  Cognitive Function:        04/25/2022    8:02 AM 04/18/2021    8:11 AM 04/16/2020    8:42 AM  6CIT Screen  What Year? 0 points 0 points 0 points  What month? 0 points 0 points 0 points  What time? 0 points  0 points  Count back from 20 0 points 0 points 0 points  Months in reverse 0 points 0 points 0 points  Repeat phrase 0 points 2 points   Total Score 0 points      Immunizations Immunization History  Administered Date(s) Administered   Fluad Quad(high Dose 65+) 09/10/2021   Hepatitis B 10/08/2009, 11/07/2009, 07/17/2010   Influenza Split 09/02/2011, 09/14/2012, 09/25/2015   Influenza Whole 08/16/2010, 10/05/2018   Influenza, High Dose Seasonal PF 10/05/2018, 08/29/2019, 08/28/2020   Influenza-Unspecified 09/07/2016   PFIZER Comirnaty(Gray Top)Covid-19 Tri-Sucrose Vaccine 06/24/2021   PFIZER(Purple Top)SARS-COV-2 Vaccination 01/14/2020, 02/04/2020, 09/11/2020   Pneumococcal Conjugate-13 07/14/2020  Pneumococcal Polysaccharide-23 10/25/2018, 07/06/2019   Tdap 08/16/2010, 06/17/2021   Zoster Recombinat (Shingrix) 07/25/2020, 10/16/2020   Zoster, Live 12/12/2013    TDAP status: Up to date  Flu Vaccine status: Up to date  Pneumococcal vaccine status: Up to date  Covid-19 vaccine status: Completed vaccines  Qualifies for  Shingles Vaccine? Yes   Zostavax completed Yes   Shingrix Completed?: Yes  Screening Tests Health Maintenance  Topic Date Due   COVID-19 Vaccine (5 - Booster for Pfizer series) 08/19/2021   INFLUENZA VACCINE  07/08/2022   MAMMOGRAM  08/15/2022   DEXA SCAN  05/26/2023   COLONOSCOPY (Pts [id number] Insurance coverage will need to be confirmed)  06/18/2028   TETANUS/TDAP  06/18/2031   Pneumonia Vaccine 4865+ Years old  Completed   Hepatitis C Screening  Completed   Zoster Vaccines- Shingrix  Completed   HPV VACCINES  Aged Out    Health Maintenance  Health Maintenance Due  Topic Date Due   COVID-19 Vaccine (5 - Booster for Pfizer series) 08/19/2021    Colorectal cancer screening: Type of screening: Colonoscopy. Completed 06/18/18. Repeat every 10 years  Mammogram status: Completed 08/15/21. Repeat every year  Bone Density status: Completed 05/25/20. Results reflect: Bone density results: OSTEOPENIA. Repeat every 3 years.    Additional Screening:  Hepatitis C Screening:  Completed 01/22/15  Vision Screening: Recommended annual ophthalmology exams for early detection of glaucoma and other disorders of the eye. Is the patient up to date with their annual eye exam?  Yes  Who is the provider or what is the name of the office in which the patient attends annual eye exams? [facility]  If pt is not established with a provider, would they like to be referred to a provider to establish care? No .   Dental Screening: Recommended annual dental exams for proper oral hygiene  Community Resource Referral / Chronic Care Management: CRR required this visit?  No   CCM required this visit?  No      Plan:     I have personally reviewed and noted the following in the patient's chart:   Medical and social history Use of alcohol, tobacco or illicit drugs  Current medications and supplements including opioid prescriptions.  Functional ability and status Nutritional status Physical  activity Advanced directives List of other physicians Hospitalizations, surgeries, and ER visits in previous 12 months Vitals Screenings to include cognitive, depression, and falls Referrals and appointments  In addition, I have reviewed and discussed with patient certain preventive protocols, quality metrics, and best practice recommendations. A written personalized care plan for preventive services as well as general preventive health recommendations were provided to patient.     Marzella Schleinina H Zamarian Scarano, LPN   1/[id number]/09605/19/2023   Nurse Notes: none

## 2022-04-25 NOTE — Patient Instructions (Signed)
Karina Ingram , Thank you for taking time to come for your Medicare Wellness Visit. I appreciate your ongoing commitment to your health goals. Please review the following plan we discussed and let me know if I can assist you in the future.   Screening recommendations/referrals: Colonoscopy: Done 06/18/18 repeat every 10 years  Mammogram: Done 08/15/21 repeat every year  Bone Density: Done 05/25/20 repeat every 3 years  Recommended yearly ophthalmology/optometry visit for glaucoma screening and checkup Recommended yearly dental visit for hygiene and checkup  Vaccinations: Influenza vaccine: Done 09/10/21 repeat every year  Pneumococcal vaccine: Up to date Tdap vaccine: Done 06/17/21 repeat every 10 years  Shingles vaccine: Completed  8/18, 10/16/20 Covid-19:Completed 2/6, 2/27, 09/11/20 & 06/24/21  Advanced directives: Please bring a copy of your health care power of attorney and living will to the office at your convenience.  Conditions/risks identified: Maintain good health and stronger each year  Next appointment: Follow up in one year for your annual wellness visit    Preventive Care 65 Years and Older, Female Preventive care refers to lifestyle choices and visits with your health care provider that can promote health and wellness. What does preventive care include? A yearly physical exam. This is also called an annual well check. Dental exams once or twice a year. Routine eye exams. Ask your health care provider how often you should have your eyes checked. Personal lifestyle choices, including: Daily care of your teeth and gums. Regular physical activity. Eating a healthy diet. Avoiding tobacco and drug use. Limiting alcohol use. Practicing safe sex. Taking low-dose aspirin every day. Taking vitamin and mineral supplements as recommended by your health care provider. What happens during an annual well check? The services and screenings done by your health care provider during your  annual well check will depend on your age, overall health, lifestyle risk factors, and family history of disease. Counseling  Your health care provider may ask you questions about your: Alcohol use. Tobacco use. Drug use. Emotional well-being. Home and relationship well-being. Sexual activity. Eating habits. History of falls. Memory and ability to understand (cognition). Work and work Statistician. Reproductive health. Screening  You may have the following tests or measurements: Height, weight, and BMI. Blood pressure. Lipid and cholesterol levels. These may be checked every 5 years, or more frequently if you are over 45 years old. Skin check. Lung cancer screening. You may have this screening every year starting at age 28 if you have a 30-pack-year history of smoking and currently smoke or have quit within the past 15 years. Fecal occult blood test (FOBT) of the stool. You may have this test every year starting at age 72. Flexible sigmoidoscopy or colonoscopy. You may have a sigmoidoscopy every 5 years or a colonoscopy every 10 years starting at age 80. Hepatitis C blood test. Hepatitis B blood test. Sexually transmitted disease (STD) testing. Diabetes screening. This is done by checking your blood sugar (glucose) after you have not eaten for a while (fasting). You may have this done every 1-3 years. Bone density scan. This is done to screen for osteoporosis. You may have this done starting at age 28. Mammogram. This may be done every 1-2 years. Talk to your health care provider about how often you should have regular mammograms. Talk with your health care provider about your test results, treatment options, and if necessary, the need for more tests. Vaccines  Your health care provider may recommend certain vaccines, such as: Influenza vaccine. This is recommended every year.  Tetanus, diphtheria, and acellular pertussis (Tdap, Td) vaccine. You may need a Td booster every 10  years. Zoster vaccine. You may need this after age 58. Pneumococcal 13-valent conjugate (PCV13) vaccine. One dose is recommended after age 23. Pneumococcal polysaccharide (PPSV23) vaccine. One dose is recommended after age 25. Talk to your health care provider about which screenings and vaccines you need and how often you need them. This information is not intended to replace advice given to you by your health care provider. Make sure you discuss any questions you have with your health care provider. Document Released: 12/21/2015 Document Revised: 08/13/2016 Document Reviewed: 09/25/2015 Elsevier Interactive Patient Education  2017 [place] Prevention in the Home Falls can cause injuries. They can happen to people of all ages. There are many things you can do to make your home safe and to help prevent falls. What can I do on the outside of my home? Regularly fix the edges of walkways and driveways and fix any cracks. Remove anything that might make you trip as you walk through a door, such as a raised step or threshold. Trim any bushes or trees on the path to your home. Use bright outdoor lighting. Clear any walking paths of anything that might make someone trip, such as rocks or tools. Regularly check to see if handrails are loose or broken. Make sure that both sides of any steps have handrails. Any raised decks and porches should have guardrails on the edges. Have any leaves, snow, or ice cleared regularly. Use sand or salt on walking paths during winter. Clean up any spills in your garage right away. This includes oil or grease spills. What can I do in the bathroom? Use night lights. Install grab bars by the toilet and in the tub and shower. Do not use towel bars as grab bars. Use non-skid mats or decals in the tub or shower. If you need to sit down in the shower, use a plastic, non-slip stool. Keep the floor dry. Clean up any water that spills on the floor as soon as it  happens. Remove soap buildup in the tub or shower regularly. Attach bath mats securely with double-sided non-slip rug tape. Do not have throw rugs and other things on the floor that can make you trip. What can I do in the bedroom? Use night lights. Make sure that you have a light by your bed that is easy to reach. Do not use any sheets or blankets that are too big for your bed. They should not hang down onto the floor. Have a firm chair that has side arms. You can use this for support while you get dressed. Do not have throw rugs and other things on the floor that can make you trip. What can I do in the kitchen? Clean up any spills right away. Avoid walking on wet floors. Keep items that you use a lot in easy-to-reach places. If you need to reach something above you, use a strong step stool that has a grab bar. Keep electrical cords out of the way. Do not use floor polish or wax that makes floors slippery. If you must use wax, use non-skid floor wax. Do not have throw rugs and other things on the floor that can make you trip. What can I do with my stairs? Do not leave any items on the stairs. Make sure that there are handrails on both sides of the stairs and use them. Fix handrails that are broken or loose.  Make sure that handrails are as long as the stairways. Check any carpeting to make sure that it is firmly attached to the stairs. Fix any carpet that is loose or worn. Avoid having throw rugs at the top or bottom of the stairs. If you do have throw rugs, attach them to the floor with carpet tape. Make sure that you have a light switch at the top of the stairs and the bottom of the stairs. If you do not have them, ask someone to add them for you. What else can I do to help prevent falls? Wear shoes that: Do not have high heels. Have rubber bottoms. Are comfortable and fit you well. Are closed at the toe. Do not wear sandals. If you use a stepladder: Make sure that it is fully opened.  Do not climb a closed stepladder. Make sure that both sides of the stepladder are locked into place. Ask someone to hold it for you, if possible. Clearly mark and make sure that you can see: Any grab bars or handrails. First and last steps. Where the edge of each step is. Use tools that help you move around (mobility aids) if they are needed. These include: Canes. Walkers. Scooters. Crutches. Turn on the lights when you go into a dark area. Replace any light bulbs as soon as they burn out. Set up your furniture so you have a clear path. Avoid moving your furniture around. If any of your floors are uneven, fix them. If there are any pets around you, be aware of where they are. Review your medicines with your doctor. Some medicines can make you feel dizzy. This can increase your chance of falling. Ask your doctor what other things that you can do to help prevent falls. This information is not intended to replace advice given to you by your health care provider. Make sure you discuss any questions you have with your health care provider. Document Released: 09/20/2009 Document Revised: 05/01/2016 Document Reviewed: 12/29/2014 Elsevier Interactive Patient Education  2017 [employer].

## 2022-05-08 ENCOUNTER — Encounter: Payer: Self-pay | Admitting: Family Medicine

## 2022-05-19 DIAGNOSIS — D2262 Melanocytic nevi of left upper limb, including shoulder: Secondary | ICD-10-CM | POA: Diagnosis not present

## 2022-05-19 DIAGNOSIS — D1801 Hemangioma of skin and subcutaneous tissue: Secondary | ICD-10-CM | POA: Diagnosis not present

## 2022-05-19 DIAGNOSIS — D225 Melanocytic nevi of trunk: Secondary | ICD-10-CM | POA: Diagnosis not present

## 2022-05-19 DIAGNOSIS — D220 Melanocytic nevi of lip: Secondary | ICD-10-CM | POA: Diagnosis not present

## 2022-05-19 DIAGNOSIS — D2261 Melanocytic nevi of right upper limb, including shoulder: Secondary | ICD-10-CM | POA: Diagnosis not present

## 2022-05-19 DIAGNOSIS — L814 Other melanin hyperpigmentation: Secondary | ICD-10-CM | POA: Diagnosis not present

## 2022-05-19 DIAGNOSIS — D2271 Melanocytic nevi of right lower limb, including hip: Secondary | ICD-10-CM | POA: Diagnosis not present

## 2022-05-19 DIAGNOSIS — D2272 Melanocytic nevi of left lower limb, including hip: Secondary | ICD-10-CM | POA: Diagnosis not present

## 2022-05-19 DIAGNOSIS — L821 Other seborrheic keratosis: Secondary | ICD-10-CM | POA: Diagnosis not present

## 2022-06-23 ENCOUNTER — Other Ambulatory Visit: Payer: Self-pay | Admitting: Family Medicine

## 2022-06-23 DIAGNOSIS — J301 Allergic rhinitis due to pollen: Secondary | ICD-10-CM

## 2022-07-02 ENCOUNTER — Other Ambulatory Visit: Payer: Self-pay | Admitting: Family Medicine

## 2022-07-02 DIAGNOSIS — Z1231 Encounter for screening mammogram for malignant neoplasm of breast: Secondary | ICD-10-CM

## 2022-07-16 ENCOUNTER — Encounter (INDEPENDENT_AMBULATORY_CARE_PROVIDER_SITE_OTHER): Payer: Self-pay

## 2022-08-18 ENCOUNTER — Ambulatory Visit
Admission: RE | Admit: 2022-08-18 | Discharge: 2022-08-18 | Disposition: A | Discharge status: Home / Self Care | Payer: Medicare PPO | Source: Ambulatory Visit | Attending: Family Medicine | Admitting: Family Medicine

## 2022-08-18 DIAGNOSIS — Z1231 Encounter for screening mammogram for malignant neoplasm of breast: Secondary | ICD-10-CM

## 2022-09-01 ENCOUNTER — Encounter: Payer: Self-pay | Admitting: *Deleted

## 2022-09-22 ENCOUNTER — Other Ambulatory Visit: Payer: Self-pay | Admitting: Family Medicine

## 2022-09-22 DIAGNOSIS — J301 Allergic rhinitis due to pollen: Secondary | ICD-10-CM

## 2022-10-15 ENCOUNTER — Other Ambulatory Visit: Payer: Self-pay

## 2022-10-15 ENCOUNTER — Encounter: Payer: Self-pay | Admitting: Family Medicine

## 2022-10-15 NOTE — Telephone Encounter (Signed)
Chart has been updated.

## 2022-10-24 IMAGING — MG MM DIGITAL SCREENING BILAT W/ TOMO AND CAD
8 series · 8 of 24 positions shown · non-contrast
Comparison: Previous exam(s).

CLINICAL DATA: Screening.

EXAM:
DIGITAL SCREENING BILATERAL MAMMOGRAM WITH TOMOSYNTHESIS AND CAD
TECHNIQUE: Bilateral screening digital craniocaudal and mediolateral oblique
mammograms were obtained. Bilateral screening digital breast
tomosynthesis was performed. The images were evaluated with
computer-aided detection.

[L CC synth-2D]
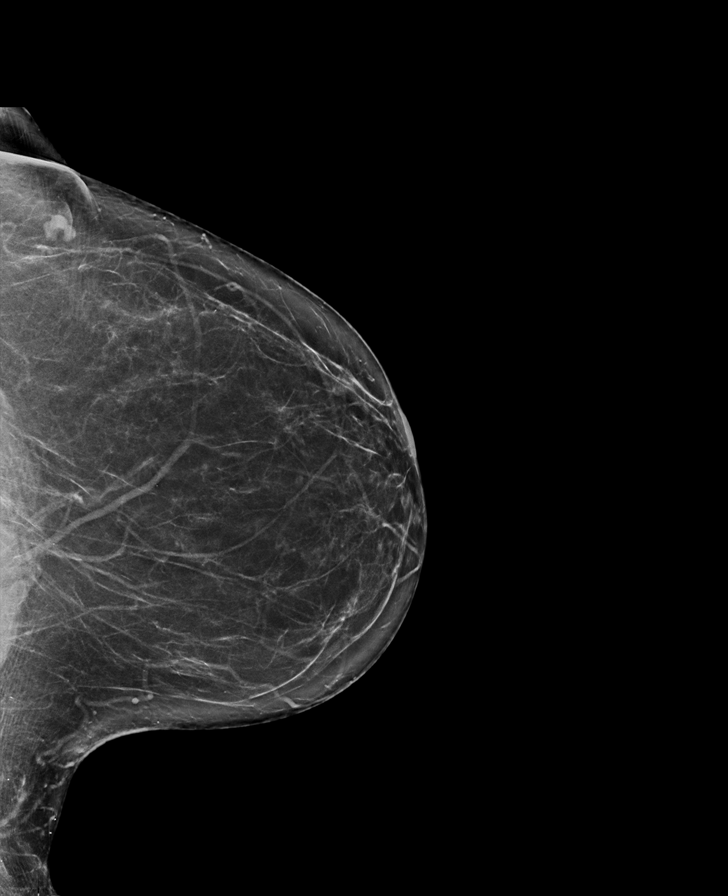

[R CC synth-2D]
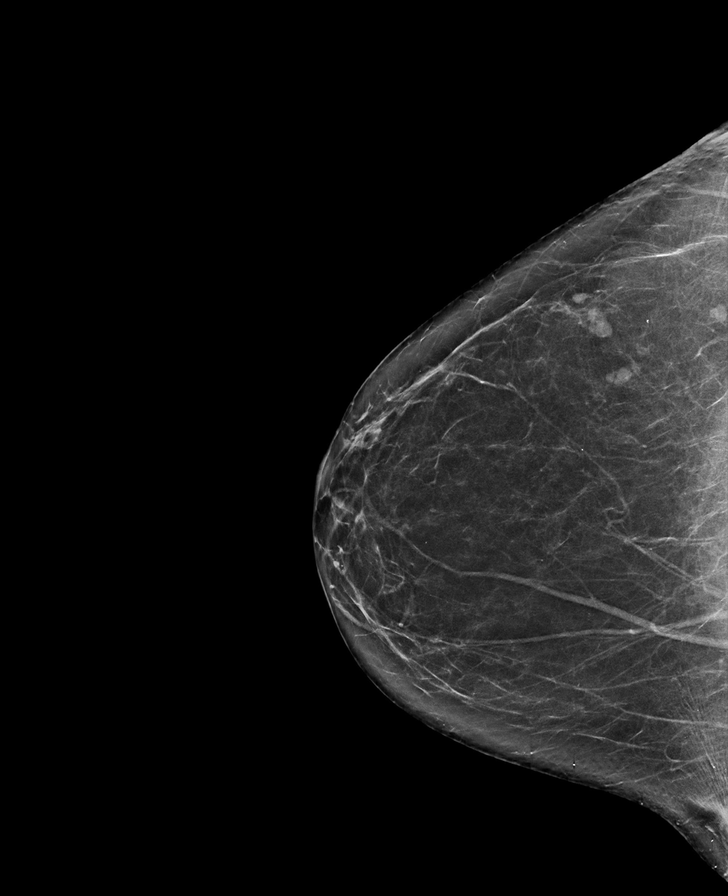

[L MLO synth-2D]
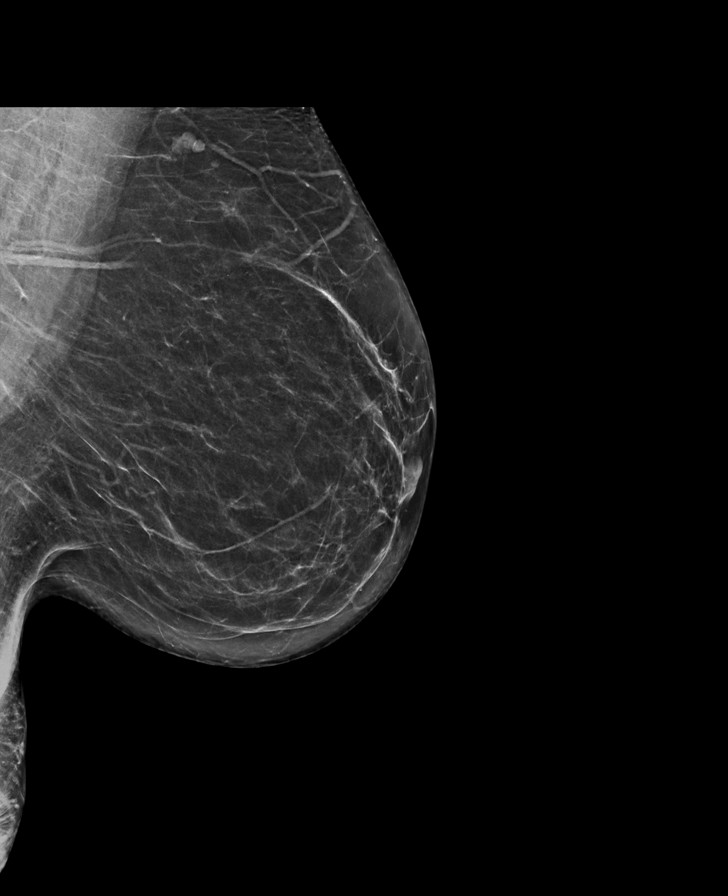

[R MLO synth-2D]
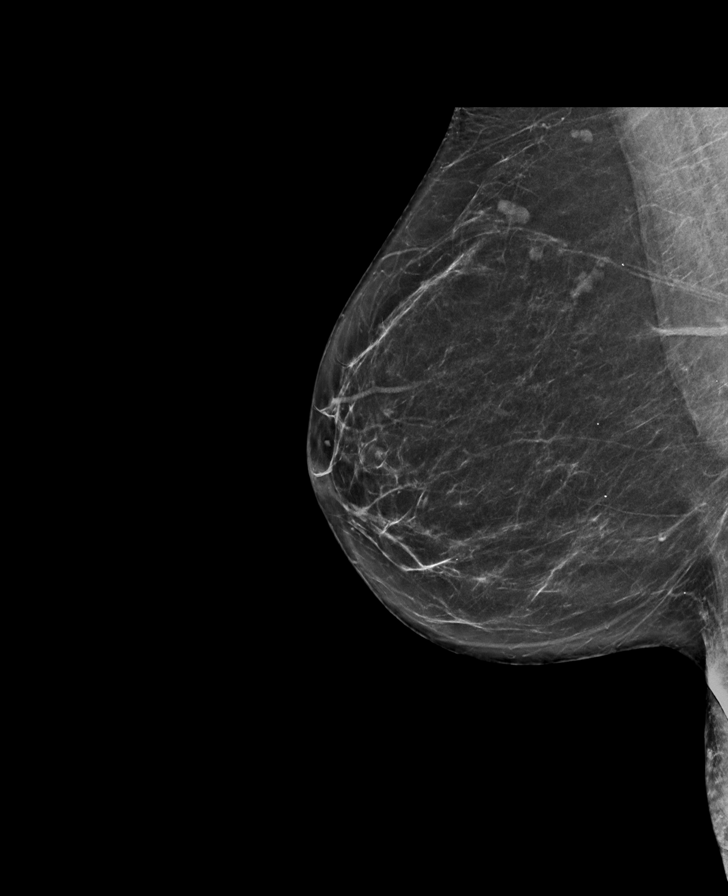

[R MLO tomo · tomo slice 35/69.0]
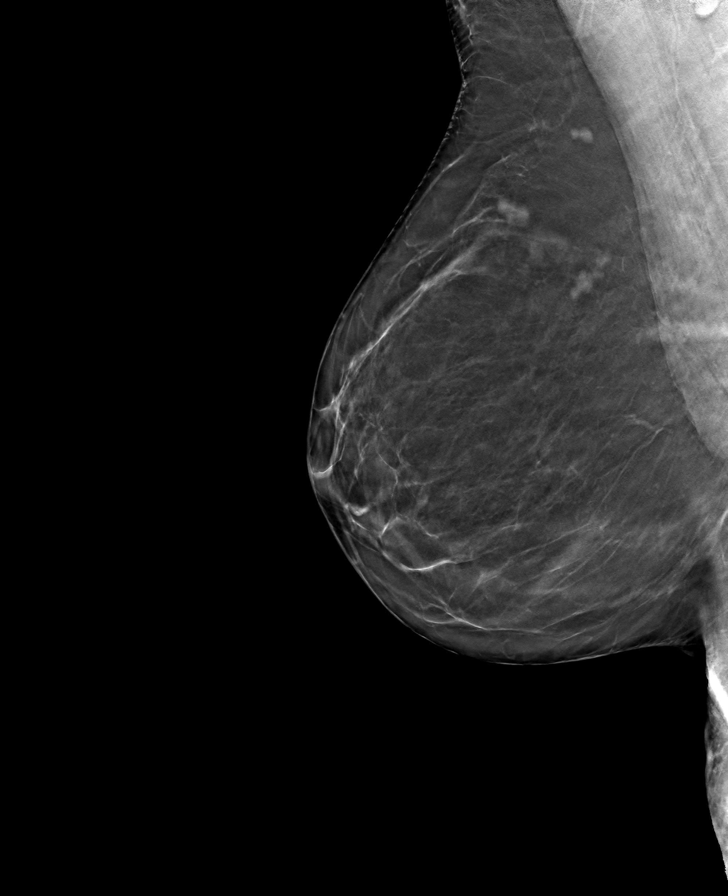

[R CC tomo · tomo slice 35/69.0]
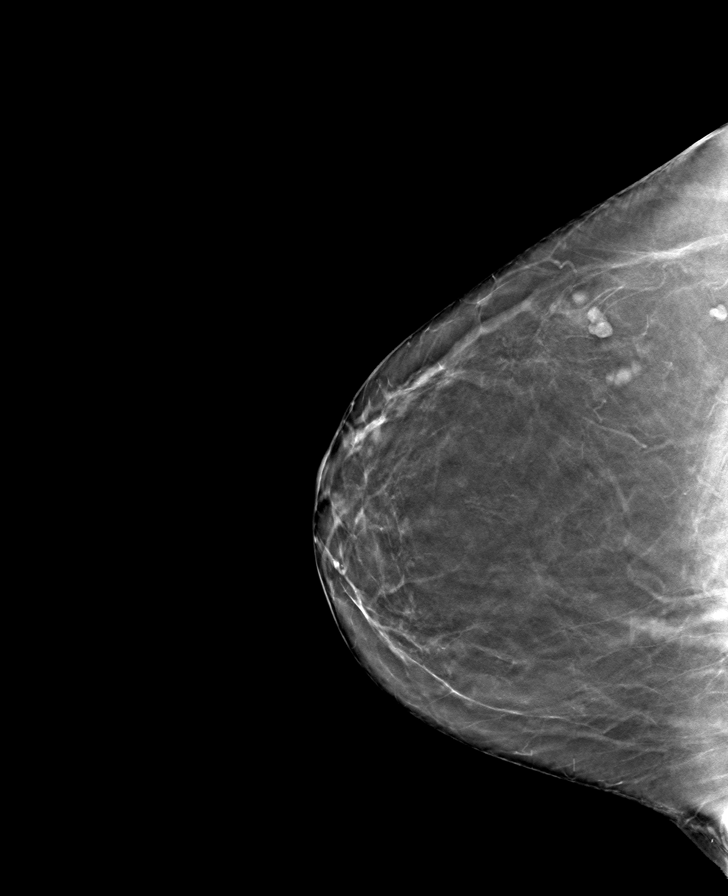

[L CC tomo · tomo slice 35/69.0]
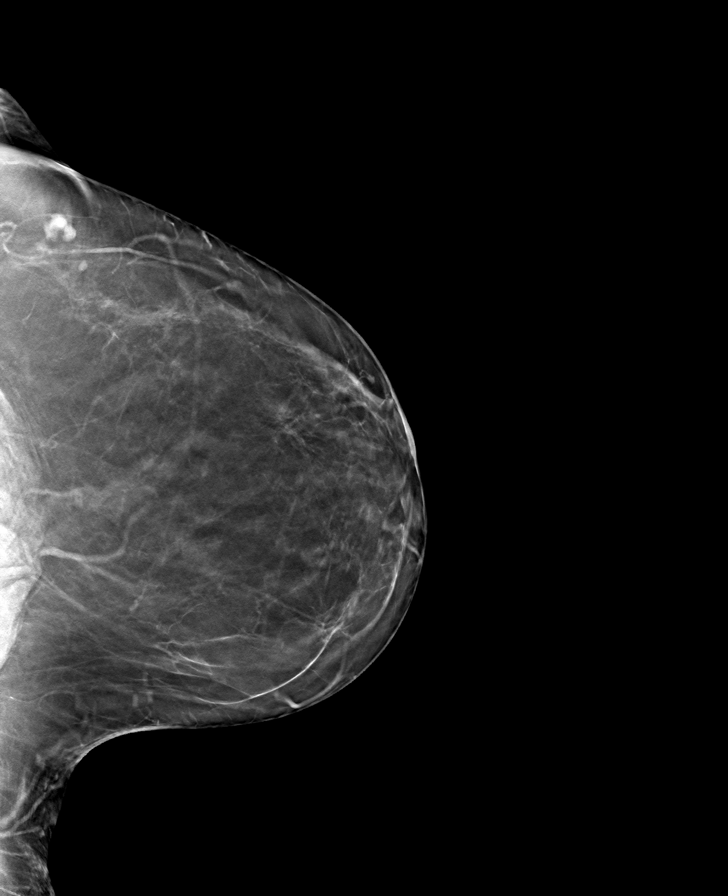

[L MLO tomo · tomo slice 35/68.0]
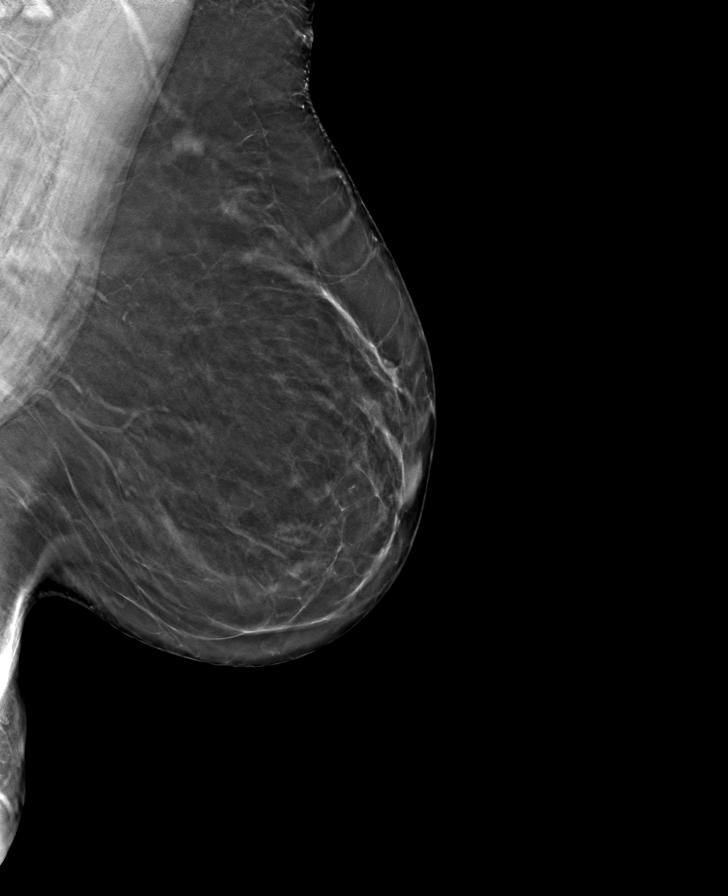

[8 of 24 positions shown; findings below may reference images not displayed]

ACR Breast Density Category b: There are scattered areas of
fibroglandular density.
FINDINGS: There are no findings suspicious for malignancy.
IMPRESSION: No mammographic evidence of malignancy. A result letter of this
screening mammogram will be mailed directly to the patient.

RECOMMENDATION:
Screening mammogram in one year. (Code:51-O-LD2)

BI-RADS CATEGORY  1: Negative.

## 2022-11-19 IMAGING — DX DG KNEE AP/LAT W/ SUNRISE*R*
3 series · 3 of 3 positions shown · non-contrast
Comparison: None.

CLINICAL DATA: Fall and right knee pain.

EXAM:
RIGHT KNEE 3 VIEWS

[knee ap]
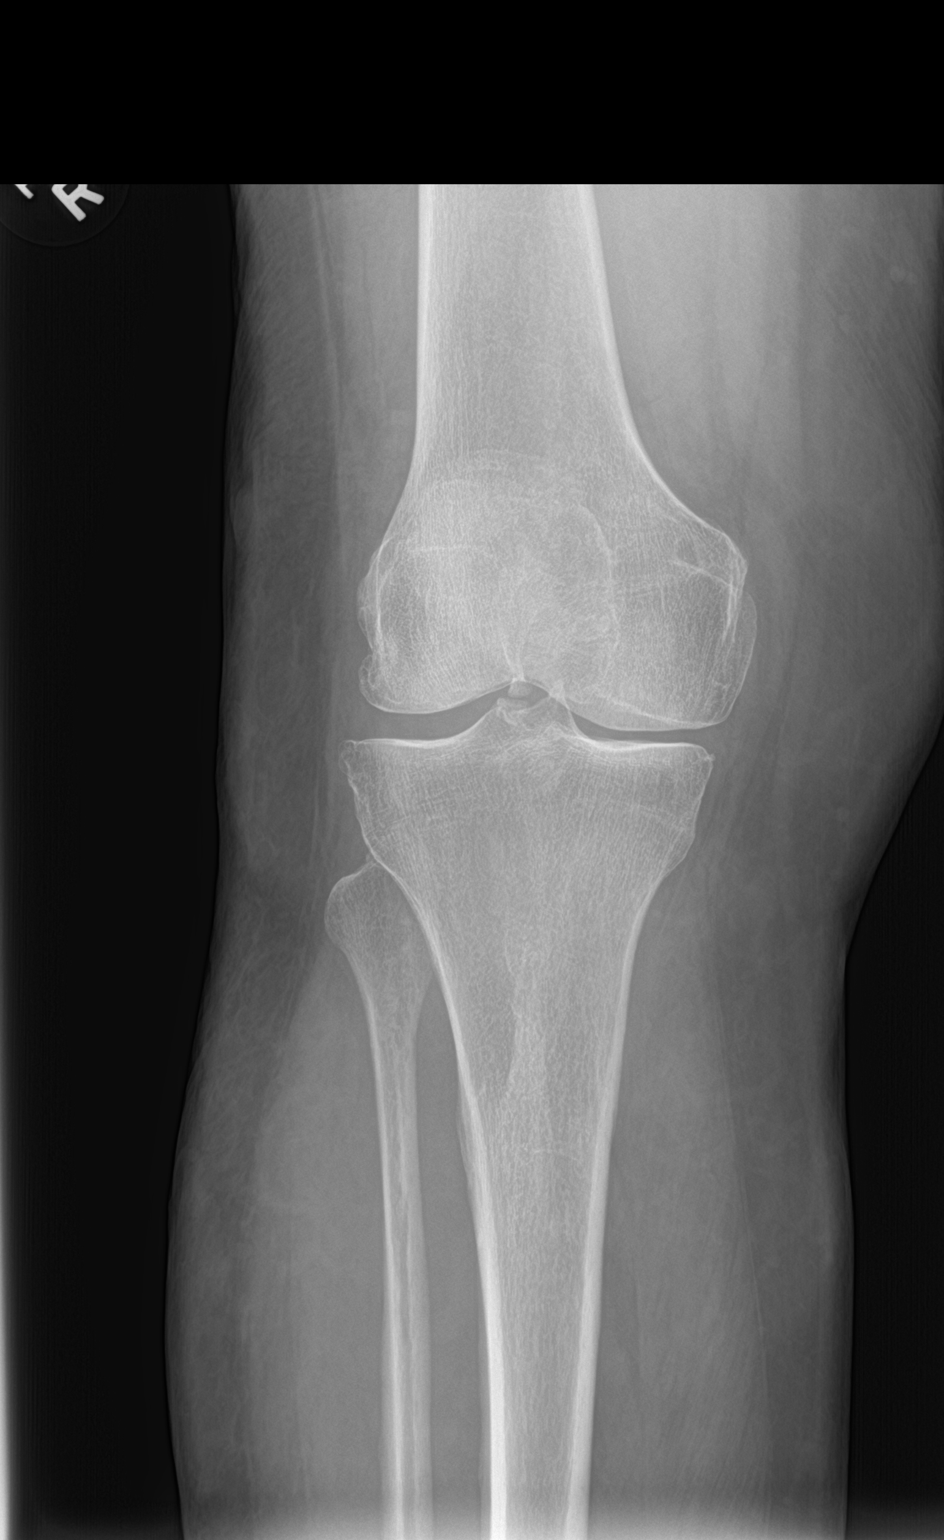

[knee lat]
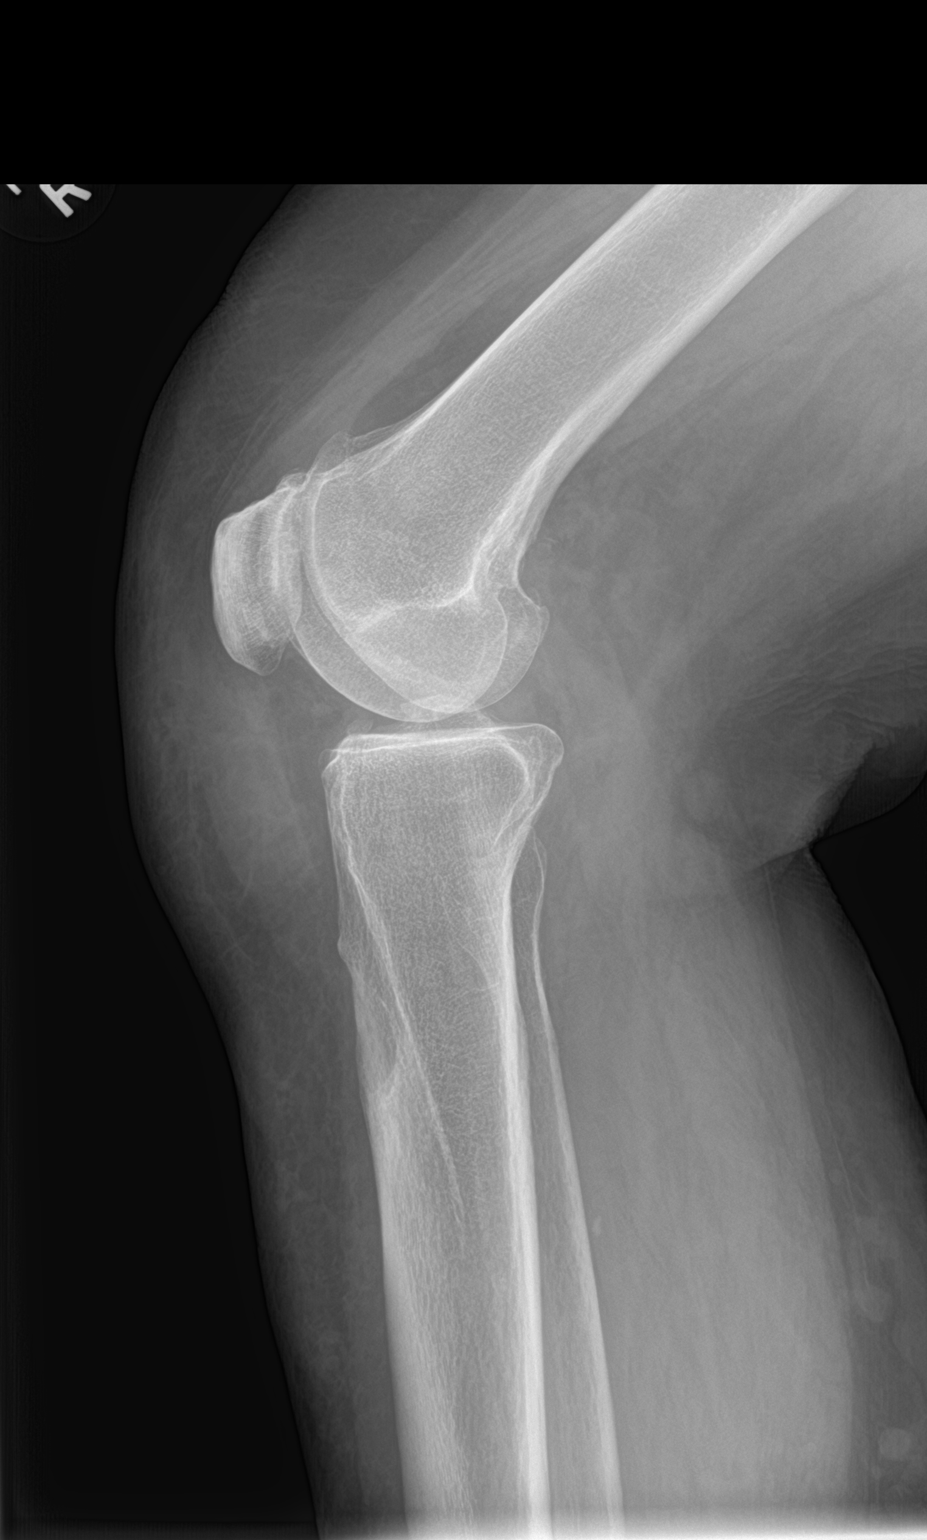

[sunrise]
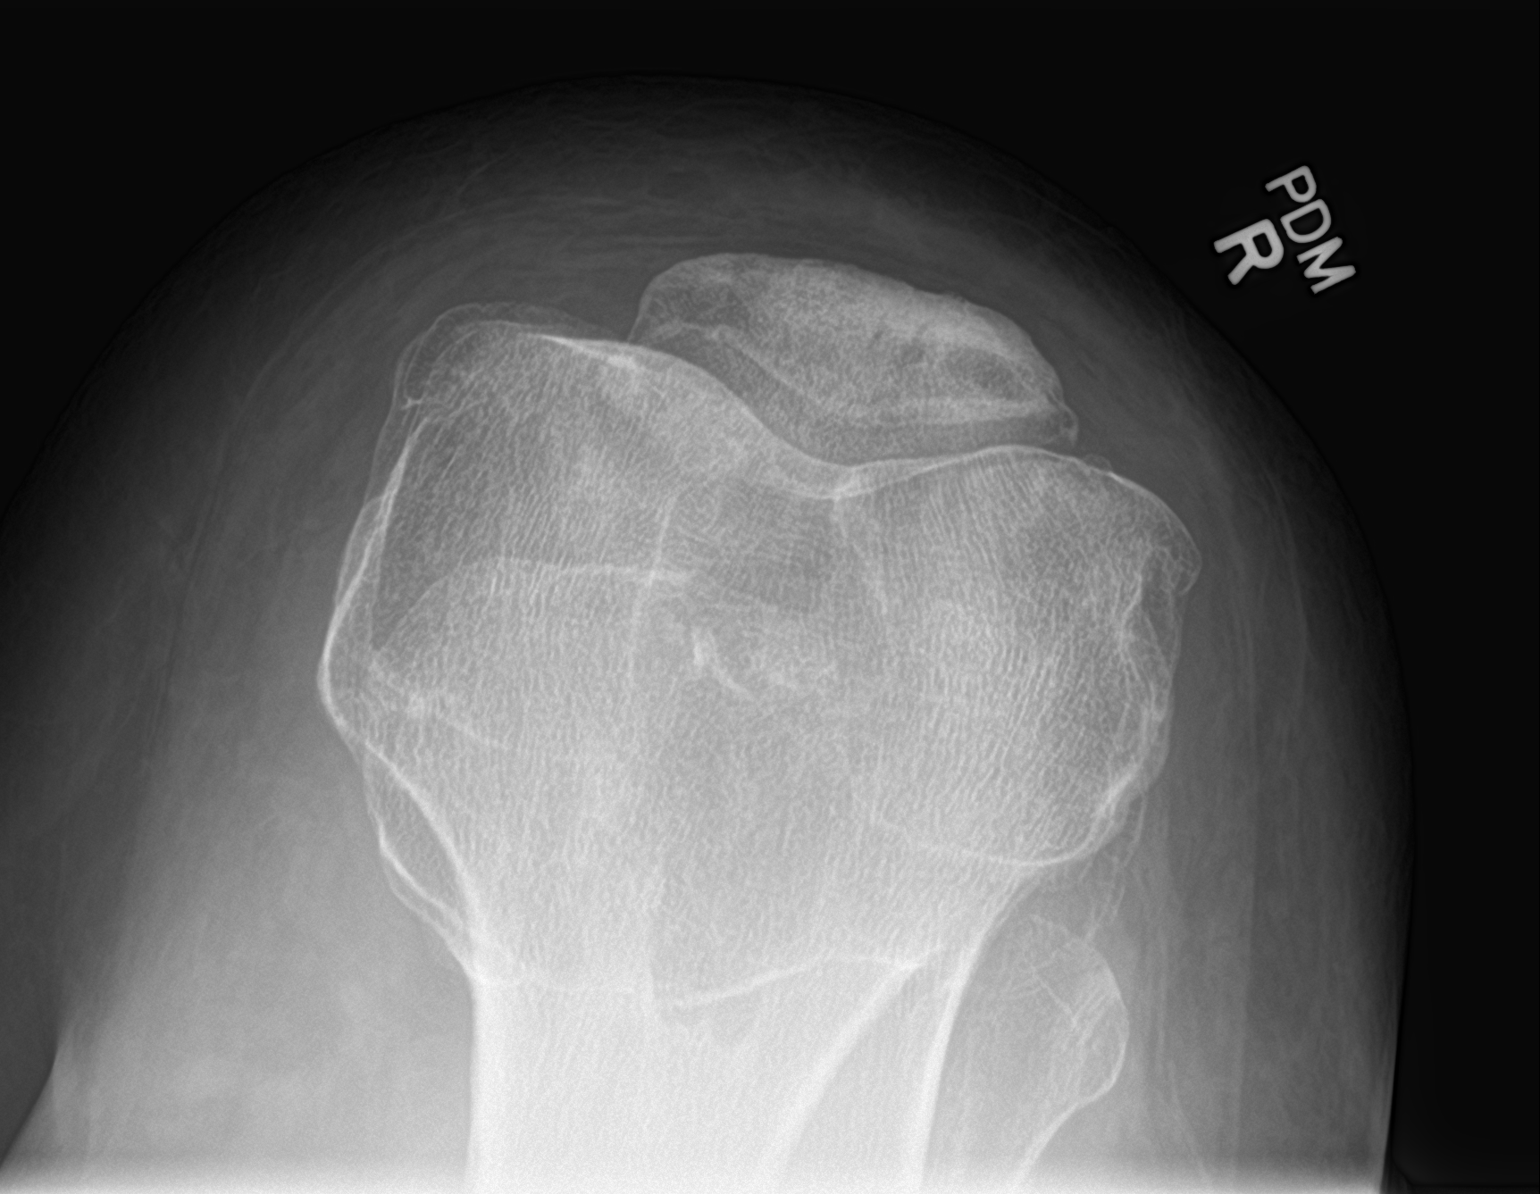

[3 of 3 positions shown; findings below may reference images not displayed]

FINDINGS: There is no acute fracture or dislocation. There is osteoarthritis
with tricompartmental narrowing and moderate narrowing of the medial
compartment. No significant joint effusion. There is soft tissue
swelling of the anterior knee and anterior to the patellar tendon.
IMPRESSION: 1. No acute fracture or dislocation.
2. Osteoarthritis.
3. Soft tissue swelling anterior to the knee.

## 2022-11-25 ENCOUNTER — Other Ambulatory Visit: Payer: Self-pay | Admitting: Family Medicine

## 2022-12-07 ENCOUNTER — Other Ambulatory Visit: Payer: Self-pay | Admitting: Family Medicine

## 2022-12-19 ENCOUNTER — Other Ambulatory Visit: Payer: Self-pay | Admitting: Family Medicine

## 2022-12-19 DIAGNOSIS — J301 Allergic rhinitis due to pollen: Secondary | ICD-10-CM

## 2023-01-13 DIAGNOSIS — J0191 Acute recurrent sinusitis, unspecified: Secondary | ICD-10-CM | POA: Diagnosis not present

## 2023-01-13 DIAGNOSIS — M1812 Unilateral primary osteoarthritis of first carpometacarpal joint, left hand: Secondary | ICD-10-CM | POA: Diagnosis not present

## 2023-01-13 DIAGNOSIS — I10 Essential (primary) hypertension: Secondary | ICD-10-CM | POA: Diagnosis not present

## 2023-01-13 DIAGNOSIS — M2042 Other hammer toe(s) (acquired), left foot: Secondary | ICD-10-CM | POA: Diagnosis not present

## 2023-01-13 DIAGNOSIS — E785 Hyperlipidemia, unspecified: Secondary | ICD-10-CM | POA: Diagnosis not present

## 2023-01-13 DIAGNOSIS — Z0000 Encounter for general adult medical examination without abnormal findings: Secondary | ICD-10-CM | POA: Diagnosis not present

## 2023-04-30 ENCOUNTER — Encounter: Payer: Medicare PPO | Admitting: Family Medicine
